# Patient Record
Sex: Female | Born: 1935 | Race: White | Hispanic: No | Smoking: Never smoker
Health system: Southern US, Community
[De-identification: ages and names within clinical notes are randomized; demographics above are authoritative.]

## PROBLEM LIST (undated history)

## (undated) DIAGNOSIS — E039 Hypothyroidism, unspecified: Secondary | ICD-10-CM

## (undated) DIAGNOSIS — K219 Gastro-esophageal reflux disease without esophagitis: Secondary | ICD-10-CM

## (undated) DIAGNOSIS — M810 Age-related osteoporosis without current pathological fracture: Secondary | ICD-10-CM

## (undated) DIAGNOSIS — K5900 Constipation, unspecified: Secondary | ICD-10-CM

## (undated) DIAGNOSIS — E559 Vitamin D deficiency, unspecified: Secondary | ICD-10-CM

## (undated) DIAGNOSIS — S3210XA Unspecified fracture of sacrum, initial encounter for closed fracture: Secondary | ICD-10-CM

## (undated) DIAGNOSIS — R109 Unspecified abdominal pain: Secondary | ICD-10-CM

## (undated) DIAGNOSIS — I10 Essential (primary) hypertension: Secondary | ICD-10-CM

## (undated) DIAGNOSIS — M1990 Unspecified osteoarthritis, unspecified site: Secondary | ICD-10-CM

## (undated) DIAGNOSIS — H269 Unspecified cataract: Secondary | ICD-10-CM

## (undated) HISTORY — DX: Age-related osteoporosis without current pathological fracture: M81.0

## (undated) HISTORY — DX: Unspecified fracture of sacrum, initial encounter for closed fracture: S32.10XA

## (undated) HISTORY — PX: EYE SURGERY: SHX253

## (undated) HISTORY — DX: Unspecified abdominal pain: R10.9

## (undated) HISTORY — DX: Constipation, unspecified: K59.00

## (undated) HISTORY — PX: FIXATION KYPHOPLASTY LUMBAR SPINE: SHX1642

## (undated) HISTORY — DX: Hypothyroidism, unspecified: E03.9

## (undated) HISTORY — DX: Vitamin D deficiency, unspecified: E55.9

## (undated) HISTORY — DX: Unspecified cataract: H26.9

## (undated) HISTORY — DX: Gastro-esophageal reflux disease without esophagitis: K21.9

## (undated) HISTORY — PX: ABDOMINAL HYSTERECTOMY: SHX81

## (undated) HISTORY — DX: Essential (primary) hypertension: I10

---

## 2010-10-05 ENCOUNTER — Ambulatory Visit (HOSPITAL_COMMUNITY)
Admission: RE | Admit: 2010-10-05 | Discharge: 2010-10-05 | Payer: Self-pay | Source: Home / Self Care | Attending: Internal Medicine | Admitting: Internal Medicine

## 2012-10-25 DIAGNOSIS — M549 Dorsalgia, unspecified: Secondary | ICD-10-CM

## 2013-01-07 ENCOUNTER — Other Ambulatory Visit: Payer: Self-pay | Admitting: *Deleted

## 2013-01-21 DIAGNOSIS — IMO0001 Reserved for inherently not codable concepts without codable children: Secondary | ICD-10-CM

## 2013-01-21 DIAGNOSIS — R269 Unspecified abnormalities of gait and mobility: Secondary | ICD-10-CM

## 2013-01-21 DIAGNOSIS — M8448XD Pathological fracture, other site, subsequent encounter for fracture with routine healing: Secondary | ICD-10-CM

## 2013-02-07 ENCOUNTER — Telehealth: Payer: Self-pay | Admitting: Nurse Practitioner

## 2013-02-11 NOTE — Telephone Encounter (Signed)
read

## 2013-02-18 ENCOUNTER — Telehealth: Payer: Self-pay | Admitting: Nurse Practitioner

## 2013-02-18 NOTE — Telephone Encounter (Signed)
appt scheduled for may 14,2014 at 9:45 and pt aware

## 2013-02-22 ENCOUNTER — Telehealth: Payer: Self-pay

## 2013-02-22 NOTE — Telephone Encounter (Signed)
My mother is in a lot of pain  Been to the ER 4 times   Called to get an appt her and was told nothing available till 5/15  Need a referral to a specialist because that is not acceptable

## 2013-02-22 NOTE — Telephone Encounter (Signed)
What kind of pain is she having?

## 2013-02-25 NOTE — Telephone Encounter (Signed)
abdominal

## 2013-03-01 NOTE — Telephone Encounter (Signed)
FYI

## 2013-03-01 NOTE — Telephone Encounter (Signed)
Ok thank you 

## 2013-03-06 ENCOUNTER — Telehealth: Payer: Self-pay | Admitting: Nurse Practitioner

## 2013-03-06 ENCOUNTER — Ambulatory Visit: Payer: Self-pay | Admitting: Nurse Practitioner

## 2013-03-06 MED ORDER — HYDROCODONE-ACETAMINOPHEN 5-325 MG PO TABS: 1.0000 | ORAL_TABLET | Freq: Four times a day (QID) | ORAL | Status: DC | PRN

## 2013-03-06 NOTE — Telephone Encounter (Signed)
MMM TO ADDRESSS

## 2013-03-06 NOTE — Telephone Encounter (Signed)
Patient aware.

## 2013-03-06 NOTE — Telephone Encounter (Signed)
rx ready to pick up

## 2013-03-07 ENCOUNTER — Other Ambulatory Visit: Payer: Self-pay | Admitting: Nurse Practitioner

## 2013-03-07 MED ORDER — OXYCODONE HCL 5 MG PO TABA: 5.0000 mg | ORAL_TABLET | Freq: Two times a day (BID) | ORAL | Status: DC

## 2013-03-13 ENCOUNTER — Ambulatory Visit (INDEPENDENT_AMBULATORY_CARE_PROVIDER_SITE_OTHER): Payer: Medicare Other

## 2013-03-13 ENCOUNTER — Ambulatory Visit: Payer: Self-pay | Admitting: Nurse Practitioner

## 2013-03-13 ENCOUNTER — Ambulatory Visit (INDEPENDENT_AMBULATORY_CARE_PROVIDER_SITE_OTHER): Payer: Medicare Other | Admitting: Pharmacist

## 2013-03-13 ENCOUNTER — Encounter: Payer: Self-pay | Admitting: Pharmacist

## 2013-03-13 VITALS — Ht 61.0 in | Wt 116.0 lb

## 2013-03-13 DIAGNOSIS — R748 Abnormal levels of other serum enzymes: Secondary | ICD-10-CM | POA: Insufficient documentation

## 2013-03-13 DIAGNOSIS — E039 Hypothyroidism, unspecified: Secondary | ICD-10-CM

## 2013-03-13 DIAGNOSIS — S3210XA Unspecified fracture of sacrum, initial encounter for closed fracture: Secondary | ICD-10-CM | POA: Insufficient documentation

## 2013-03-13 DIAGNOSIS — S3210XD Unspecified fracture of sacrum, subsequent encounter for fracture with routine healing: Secondary | ICD-10-CM

## 2013-03-13 DIAGNOSIS — IMO0001 Reserved for inherently not codable concepts without codable children: Secondary | ICD-10-CM

## 2013-03-13 DIAGNOSIS — M810 Age-related osteoporosis without current pathological fracture: Secondary | ICD-10-CM

## 2013-03-13 DIAGNOSIS — Z780 Asymptomatic menopausal state: Secondary | ICD-10-CM

## 2013-03-13 LAB — BASIC METABOLIC PANEL WITH GFR
CO2: 31 mEq/L (ref 19–32)
Calcium: 9.7 mg/dL (ref 8.4–10.5)
Creat: 0.77 mg/dL (ref 0.50–1.10)
GFR, Est African American: 82 mL/min
GFR, Est Non African American: 71 mL/min
Glucose, Bld: 92 mg/dL (ref 70–99)
Potassium: 4.2 mEq/L (ref 3.5–5.3)
Sodium: 142 mEq/L (ref 135–145)

## 2013-03-13 LAB — HEPATIC FUNCTION PANEL
Total Bilirubin: 0.4 mg/dL (ref 0.3–1.2)
Total Protein: 6.7 g/dL (ref 6.0–8.3)

## 2013-03-13 LAB — HM DEXA SCAN

## 2013-03-13 NOTE — Patient Instructions (Signed)

## 2013-03-13 NOTE — Progress Notes (Signed)
Patient ID: Belinda Collins, female   DOB: [birth year], 79 y.o.   MRN: [id number]  [facility] Current Height: Height: 5\' 1"  (154.9 cm)      Max Lifetime Height:  5\' 5"  Current Weight: Weight: 116 lb (52.617 kg)       Ethnicity:Caucasian    HPI: Does pt already have a diagnosis of:  Osteopenia?  No Osteoporosis?  No  Back Pain?  Yes       Kyphosis?  Yes Prior fracture?  Yes - sacral fracture 2013 Med(s) for Osteoporosis/Osteopenia:  none Med(s) previously tried for Osteoporosis/Osteopenia:  none                                                             PMH: Age at menopause:  79 yo Hysterectomy?  No Oophorectomy?  1 ovary removed, 1 ovary remains HRT? No Steroid Use?  No Thyroid med?  Yes History of cancer?  No History of digestive disorders (ie Crohn's)?  Yes Current or previous eating disorders?  No Last Vitamin D Result:  27 (01/2012) Last GFR Result:  54 (12/13/2012)   FH/SH: Family history of osteoporosis?  No Parent with history of hip fracture?  No Family history of breast cancer?  No Exercise?  N Very seldom drinks soda/carbinated drinks Smoking?  No Alcohol?  No    Calcium Assessment Calcium Intake  # of servings/day  Calcium mg  Milk (8 oz) 0.5  x  300  = 150mg   Yogurt (4 oz) 0 x  200 = 0  Cheese (1 oz) 0.5 x  200 = 100mg   Other Calcium sources   250mg   Ca supplement none = none   Estimated calcium intake per day 400mg     DEXA Results Date of Test T-Score for AP Spine L1-L4 T-Score for Total Left Hip T-Score for Total Right Hip  03/13/2013 -3.8 -3.6 -3.6                  Assessment: Severe osteoporosis with h/o of sacral fracture. H/o of vitamin D deficiency  Low dietary calcium intake  Plan:  1.  I recommended Forteo and discussed the benefit of bone building with patient and her daughter, but they both refused because they thought patient could not administer Forteo herself and there if no one else to administer medication.  I showed  them sample and explain how Forteo pen works, but still refused.   I also discussed other alternatives - alendronate or Prolia.  They would like me to discuss these options with Dr. Teena Dunk the patient's gastroenterologist.  I will call Dr. Teena Dunk. 2.  recommend calcium 1200mg  daily either through supplementation   or diet.  Consider calcium citrate and formula with magnesium to help decrease constipation.  3.  Drew the following labs today - LFT, intact PTH, BMET and vitamin D 4.  Counseled and educated about fall risk and prevention.  Recheck DEXA:  2 years  Time spent counseling patient:  30 minutes

## 2013-03-14 LAB — VITAMIN D 25 HYDROXY (VIT D DEFICIENCY, FRACTURES): Vit D, 25-Hydroxy: 22 ng/mL — ABNORMAL LOW (ref 30–89)

## 2013-03-20 ENCOUNTER — Telehealth: Payer: Self-pay | Admitting: Pharmacist

## 2013-03-20 DIAGNOSIS — R748 Abnormal levels of other serum enzymes: Secondary | ICD-10-CM

## 2013-03-20 MED ORDER — VITAMIN D 1000 UNITS PO CAPS: 1000.0000 [IU] | ORAL_CAPSULE | Freq: Every day | ORAL | Status: AC

## 2013-03-20 NOTE — Telephone Encounter (Signed)
Results and labs discussed with patient's daughter.

## 2013-03-20 NOTE — Progress Notes (Signed)
Patient ID: Belinda Collins, female   DOB: 1927-01-08, 79 y.o.   MRN: [id number]   Labs showed low vitamin D and elevated alk phos.  Need to do fractionates alk phos. And start vitamin D3 1000 IU daily

## 2013-03-21 ENCOUNTER — Ambulatory Visit: Payer: Medicare Other | Admitting: Nurse Practitioner

## 2013-03-22 ENCOUNTER — Ambulatory Visit (INDEPENDENT_AMBULATORY_CARE_PROVIDER_SITE_OTHER): Payer: Medicare Other | Admitting: General Practice

## 2013-03-22 ENCOUNTER — Encounter: Payer: Self-pay | Admitting: General Practice

## 2013-03-22 ENCOUNTER — Other Ambulatory Visit: Payer: Self-pay | Admitting: Nurse Practitioner

## 2013-03-22 ENCOUNTER — Other Ambulatory Visit: Payer: Self-pay | Admitting: Pharmacist

## 2013-03-22 VITALS — BP 137/63 | HR 77 | Temp 97.5°F | Ht 61.0 in | Wt 116.0 lb

## 2013-03-22 DIAGNOSIS — R748 Abnormal levels of other serum enzymes: Secondary | ICD-10-CM

## 2013-03-22 DIAGNOSIS — R1013 Epigastric pain: Secondary | ICD-10-CM

## 2013-03-22 NOTE — Progress Notes (Signed)
  Subjective:    Patient ID: Belinda Collins, female    DOB: Mar 26, 1927, 79 y.o.   MRN: [id number]  HPI Patient presents today with abdominal pain (soreness) and lower back pain. Back pain began in January and fractured sacrum. Reports the abdominal pain seems worse after having colonoscopy on February 12, 2013. Reports a blockage was found and she was instructed to take dulcolax and miralax daily. She also reports having acid reflux, but hasn't been taking medication. She reports having multiple xrays, an ultrasound of abdomen, and an MRI. Reports last bowel movement was this am.      Review of Systems  Constitutional: Negative for fever and chills.  Respiratory: Negative for chest tightness and shortness of breath.   Cardiovascular: Negative for chest pain and palpitations.  Gastrointestinal: Negative for diarrhea, constipation and blood in stool.  Genitourinary: Negative for flank pain, difficulty urinating and pelvic pain.  Musculoskeletal: Positive for back pain.  Skin: Negative.   Neurological: Negative for dizziness, weakness and headaches.       Objective:   Physical Exam  Constitutional: She is oriented to person, place, and time. She appears well-developed and well-nourished.  Cardiovascular: Normal rate, regular rhythm and normal heart sounds.   Pulmonary/Chest: Effort normal and breath sounds normal.  Abdominal: Soft. Bowel sounds are normal. She exhibits no distension. There is tenderness in the epigastric area.  Neurological: She is alert and oriented to person, place, and time.  Skin: Skin is warm and dry.  Psychiatric: She has a normal mood and affect.          Assessment & Plan:  1. Abdominal pain, epigastric - Ambulatory referral to Gastroenterology -Restart Nexium -Continue taking medications as GI instructed RTO if symptoms worsen Patient verbalized understanding Coralie Keens, FNP-C

## 2013-03-22 NOTE — Patient Instructions (Addendum)
Gastroesophageal Reflux Disease, Adult  Gastroesophageal reflux disease (GERD) happens when acid from your stomach flows up into the esophagus. When acid comes in contact with the esophagus, the acid causes soreness (inflammation) in the esophagus. Over time, GERD may create small holes (ulcers) in the lining of the esophagus.  CAUSES   · Increased body weight. This puts pressure on the stomach, making acid rise from the stomach into the esophagus.  · Smoking. This increases acid production in the stomach.  · Drinking alcohol. This causes decreased pressure in the lower esophageal sphincter (valve or ring of muscle between the esophagus and stomach), allowing acid from the stomach into the esophagus.  · Late evening meals and a full stomach. This increases pressure and acid production in the stomach.  · A malformed lower esophageal sphincter.  Sometimes, no cause is found.  SYMPTOMS   · Burning pain in the lower part of the mid-chest behind the breastbone and in the mid-stomach area. This may occur twice a week or more often.  · Trouble swallowing.  · Sore throat.  · Dry cough.  · Asthma-like symptoms including chest tightness, shortness of breath, or wheezing.  DIAGNOSIS   Your caregiver may be able to diagnose GERD based on your symptoms. In some cases, X-rays and other tests may be done to check for complications or to check the condition of your stomach and esophagus.  TREATMENT   Your caregiver may recommend over-the-counter or prescription medicines to help decrease acid production. Ask your caregiver before starting or adding any new medicines.   HOME CARE INSTRUCTIONS   · Change the factors that you can control. Ask your caregiver for guidance concerning weight loss, quitting smoking, and alcohol consumption.  · Avoid foods and drinks that make your symptoms worse, such as:  · Caffeine or alcoholic drinks.  · Chocolate.  · Peppermint or mint flavorings.  · Garlic and onions.  · Spicy foods.  · Citrus fruits,  such as oranges, lemons, or limes.  · Tomato-based foods such as sauce, chili, salsa, and pizza.  · Fried and fatty foods.  · Avoid lying down for the 3 hours prior to your bedtime or prior to taking a nap.  · Eat small, frequent meals instead of large meals.  · Wear loose-fitting clothing. Do not wear anything tight around your waist that causes pressure on your stomach.  · Raise the head of your bed 6 to 8 inches with wood blocks to help you sleep. Extra pillows will not help.  · Only take over-the-counter or prescription medicines for pain, discomfort, or fever as directed by your caregiver.  · Do not take aspirin, ibuprofen, or other nonsteroidal anti-inflammatory drugs (NSAIDs).  SEEK IMMEDIATE MEDICAL CARE IF:   · You have pain in your arms, neck, jaw, teeth, or back.  · Your pain increases or changes in intensity or duration.  · You develop nausea, vomiting, or sweating (diaphoresis).  · You develop shortness of breath, or you faint.  · Your vomit is green, yellow, black, or looks like coffee grounds or blood.  · Your stool is red, bloody, or black.  These symptoms could be signs of other problems, such as heart disease, gastric bleeding, or esophageal bleeding.  MAKE SURE YOU:   · Understand these instructions.  · Will watch your condition.  · Will get help right away if you are not doing well or get worse.  Document Released: 07/20/2005 Document Revised: 01/02/2012 Document Reviewed: 04/29/2011  [employer]® Patient   Information ©2014 [employer].

## 2013-03-23 ENCOUNTER — Other Ambulatory Visit: Payer: Self-pay | Admitting: Nurse Practitioner

## 2013-03-25 ENCOUNTER — Telehealth: Payer: Self-pay | Admitting: General Practice

## 2013-03-25 NOTE — Telephone Encounter (Signed)
Please advise 

## 2013-03-26 ENCOUNTER — Other Ambulatory Visit: Payer: Self-pay | Admitting: General Practice

## 2013-03-26 DIAGNOSIS — E039 Hypothyroidism, unspecified: Secondary | ICD-10-CM

## 2013-03-26 LAB — ALKALINE PHOSPHATASE ISOENZYMES
Alk Phos Intestinal Calc: 0 U/L (ref <15)
Alk Phos Liver Calc: 63 U/L (ref 5–93)
Alkaline phosphatase (APISO): 105 U/L (ref 33–130)
Bone %: 40 % (ref 16–56)
Intestinal %: 0 % (ref <14)
Liver %: 60 % (ref 44–84)

## 2013-03-26 MED ORDER — LEVOTHYROXINE SODIUM 50 MCG PO TABS: 50.0000 ug | ORAL_TABLET | Freq: Every day | ORAL | Status: DC

## 2013-03-26 NOTE — Telephone Encounter (Signed)
Script sent to pharmacy. thx 

## 2013-03-27 NOTE — Telephone Encounter (Signed)
rx sent

## 2013-03-28 ENCOUNTER — Telehealth: Payer: Self-pay | Admitting: Pharmacist

## 2013-03-28 NOTE — Telephone Encounter (Signed)
Per Dr. Teena Dunk - he is ok with either fosamax or Prolia for this patient.  Called patient's daughter to discuss - decided to check on insurance coverage of Prolia.

## 2013-03-28 NOTE — Telephone Encounter (Signed)
Called to discuss treatment options for osteoporosis with Dr Teena Dunk per request of Mrs. Longanecker and her daughter.  Left message on his nurses VM.  Our suggestions were either Forteo (pt refused), Fosamax or Prolia.  Awaiting call back

## 2013-03-29 ENCOUNTER — Other Ambulatory Visit: Payer: Self-pay | Admitting: Nurse Practitioner

## 2013-04-01 ENCOUNTER — Telehealth: Payer: Self-pay | Admitting: Family Medicine

## 2013-04-01 ENCOUNTER — Telehealth: Payer: Self-pay | Admitting: Nurse Practitioner

## 2013-04-01 ENCOUNTER — Other Ambulatory Visit: Payer: Self-pay | Admitting: Family Medicine

## 2013-04-01 ENCOUNTER — Other Ambulatory Visit: Payer: Self-pay | Admitting: Nurse Practitioner

## 2013-04-01 NOTE — Telephone Encounter (Signed)
Please advise 

## 2013-04-01 NOTE — Telephone Encounter (Signed)
LAST RF 03/07/13. LAST OV 03/22/13. PLEASE PRINT AND CALL PT IF APPROVED. THANKS.

## 2013-04-01 NOTE — Progress Notes (Signed)
Rx was renewed and written by MMM and was at the front desk awaiting pick up. Ashwin Tibbs P. [place] Charon, M.D.

## 2013-04-01 NOTE — Telephone Encounter (Signed)
apparently refilled 03/29/2013.

## 2013-04-01 NOTE — Telephone Encounter (Signed)
rx ready for pickup 

## 2013-04-01 NOTE — Telephone Encounter (Signed)
Daughter called requesting rx for hydrocodone written by MMM [facility] spoke to sapana

## 2013-04-02 NOTE — Telephone Encounter (Signed)
Already picked up rx

## 2013-04-02 NOTE — Telephone Encounter (Signed)
Pt picked up last pm

## 2013-04-03 NOTE — Telephone Encounter (Signed)
rx has already been picked up at pharmacy

## 2013-04-03 NOTE — Telephone Encounter (Signed)
PAtient has already had rx refilled

## 2013-04-11 ENCOUNTER — Telehealth: Payer: Self-pay | Admitting: Nurse Practitioner

## 2013-04-11 MED ORDER — HYDROCODONE-ACETAMINOPHEN 5-325 MG PO TABS: 1.0000 | ORAL_TABLET | Freq: Three times a day (TID) | ORAL | Status: DC | PRN

## 2013-04-11 NOTE — Telephone Encounter (Signed)
rx ready for pickup 

## 2013-04-12 ENCOUNTER — Other Ambulatory Visit: Payer: Self-pay | Admitting: Nurse Practitioner

## 2013-04-12 NOTE — Telephone Encounter (Signed)
Patient aware.

## 2013-05-02 ENCOUNTER — Telehealth: Payer: Self-pay | Admitting: Pharmacist

## 2013-05-02 NOTE — Telephone Encounter (Signed)
Insurance coverage for Prolia would be 80% which mean approximate cost to patient would be $170 per injection.  (prolia administered q6 months) Called and discussed with patient's daughter - she will take with patient and let provider know next week if Belinda Collins would like to start Prolia or fosamax/alendronate.

## 2013-05-08 ENCOUNTER — Encounter: Payer: Self-pay | Admitting: Nurse Practitioner

## 2013-05-08 ENCOUNTER — Ambulatory Visit (INDEPENDENT_AMBULATORY_CARE_PROVIDER_SITE_OTHER): Payer: Medicare Other | Admitting: Nurse Practitioner

## 2013-05-08 VITALS — BP 124/72 | HR 90 | Temp 97.7°F | Ht 61.0 in | Wt 113.0 lb

## 2013-05-08 DIAGNOSIS — K219 Gastro-esophageal reflux disease without esophagitis: Secondary | ICD-10-CM

## 2013-05-08 DIAGNOSIS — K5900 Constipation, unspecified: Secondary | ICD-10-CM

## 2013-05-08 DIAGNOSIS — M810 Age-related osteoporosis without current pathological fracture: Secondary | ICD-10-CM

## 2013-05-08 DIAGNOSIS — E039 Hypothyroidism, unspecified: Secondary | ICD-10-CM

## 2013-05-08 DIAGNOSIS — I10 Essential (primary) hypertension: Secondary | ICD-10-CM | POA: Insufficient documentation

## 2013-05-08 LAB — COMPLETE METABOLIC PANEL WITH GFR
AST: 19 U/L (ref 0–37)
BUN: 19 mg/dL (ref 6–23)
Chloride: 102 mEq/L (ref 96–112)
Creat: 0.76 mg/dL (ref 0.50–1.10)
Potassium: 4.5 mEq/L (ref 3.5–5.3)
Sodium: 139 mEq/L (ref 135–145)
Total Bilirubin: 0.3 mg/dL (ref 0.3–1.2)

## 2013-05-08 LAB — THYROID PANEL WITH TSH: T3 Uptake: 42.4 % — ABNORMAL HIGH (ref 22.5–37.0)

## 2013-05-08 MED ORDER — ESOMEPRAZOLE MAGNESIUM 40 MG PO CPDR: 40.0000 mg | DELAYED_RELEASE_CAPSULE | Freq: Every day | ORAL | Status: DC

## 2013-05-08 MED ORDER — LOSARTAN POTASSIUM-HCTZ 50-12.5 MG PO TABS: 1.0000 | ORAL_TABLET | Freq: Every day | ORAL | Status: DC

## 2013-05-08 MED ORDER — LEVOTHYROXINE SODIUM 50 MCG PO TABS: 50.0000 ug | ORAL_TABLET | Freq: Every day | ORAL | Status: DC

## 2013-05-08 MED ORDER — BISACODYL 5 MG PO TBEC: 5.0000 mg | DELAYED_RELEASE_TABLET | Freq: Every day | ORAL | Status: AC | PRN

## 2013-05-08 MED ORDER — HYDROCODONE-ACETAMINOPHEN 5-325 MG PO TABS: 1.0000 | ORAL_TABLET | Freq: Three times a day (TID) | ORAL | Status: DC | PRN

## 2013-05-08 MED ORDER — DENOSUMAB 60 MG/ML ~~LOC~~ SOLN
60.0000 mg | Freq: Once | SUBCUTANEOUS | Status: AC
  Administered 2013-05-08: 60 mg via SUBCUTANEOUS

## 2013-05-08 NOTE — Progress Notes (Signed)
Subjective:    Patient ID: Belinda Collins, female    DOB: 06-14-1927, 79 y.o.   MRN: [id number]  HPI Patient in for follow-up of multiple medical problems- She is doing well other than C/O chronic back pain from osteoporosis- Her Gi Doctor suggested pain management referral- he has no other complaints today. Patient going to start on prolia.  Patient Active Problem List   Diagnosis Date Noted  . GERD (gastroesophageal reflux disease) 05/08/2013  . Elevated liver enzymes 03/13/2013  . Sacral fracture 03/13/2013  . Hypothyroidism 03/13/2013   Outpatient Encounter Prescriptions as of 05/08/2013  Medication Sig Dispense Refill  . bisacodyl (BISACODYL) 5 MG EC tablet Take 5 mg by mouth daily as needed for constipation.      . calcium carbonate (TUMS EX) 750 MG chewable tablet Chew 1 tablet by mouth daily.      . Cholecalciferol (VITAMIN D) 1000 UNITS capsule Take 1 capsule (1,000 Units total) by mouth daily.  30 capsule    . Docusate Sodium (DOCULASE PO) Take by mouth.      Marland Kitchen HYDROcodone-acetaminophen (NORCO/VICODIN) 5-325 MG per tablet Take 1 tablet by mouth every 8 (eight) hours as needed for pain.  90 tablet  0  . levothyroxine (SYNTHROID, LEVOTHROID) 50 MCG tablet Take 1 tablet (50 mcg total) by mouth daily before breakfast.  90 tablet  0  . losartan-hydrochlorothiazide (HYZAAR) 50-12.5 MG per tablet Take 1 tablet by mouth daily.      Marland Kitchen NEXIUM 40 MG capsule TAKE ONE CAPSULE BY MOUTH EVERY DAY  30 capsule  5  . polyethylene glycol powder (GLYCOLAX/MIRALAX) powder Take 17 g by mouth daily.       No facility-administered encounter medications on file as of 05/08/2013.      Review of Systems  Constitutional: Negative.  Negative for fever and appetite change.  HENT: Negative.   Respiratory: Negative.   Cardiovascular: Negative.   Gastrointestinal: Negative.   Genitourinary: Negative.   Musculoskeletal: Positive for back pain.  Neurological: Negative.   Hematological: Negative.    Psychiatric/Behavioral: Negative.        Objective:   Physical Exam  Constitutional: She is oriented to person, place, and time. She appears well-developed and well-nourished.  HENT:  Nose: Nose normal.  Mouth/Throat: Oropharynx is clear and moist.  Eyes: EOM are normal.  Neck: Trachea normal, normal range of motion and full passive range of motion without pain. Neck supple. No JVD present. Carotid bruit is not present. No thyromegaly present.  Cardiovascular: Normal rate, regular rhythm, normal heart sounds and intact distal pulses.  Exam reveals no gallop and no friction rub.   No murmur heard. Pulmonary/Chest: Effort normal and breath sounds normal.  Abdominal: Soft. Bowel sounds are normal. She exhibits no distension and no mass. There is no tenderness.  Musculoskeletal: Normal range of motion.  Lymphadenopathy:    She has no cervical adenopathy.  Neurological: She is alert and oriented to person, place, and time. She has normal reflexes.  Skin: Skin is warm and dry.  Psychiatric: She has a normal mood and affect. Her behavior is normal. Judgment and thought content normal.   BP 124/72  Pulse 90  Temp(Src) 97.7 F (36.5 C) (Oral)  Ht 5\' 1"  (1.549 m)  Wt 113 lb (51.256 kg)  BMI 21.36 kg/m2        Assessment & Plan:   1. GERD (gastroesophageal reflux disease)   2. Unspecified hypothyroidism   3. Hypertension   4. Osteoporosis   5. Constipation  Orders Placed This Encounter  Procedures  . COMPLETE METABOLIC PANEL WITH GFR  . NMR Lipoprofile with Lipids  . Thyroid Panel With TSH  . Ambulatory referral to Pain Clinic    Referral Priority:  Routine    Referral Type:  Consultation    Referral Reason:  Specialty Services Required    Requested Specialty:  Pain Medicine    Number of Visits Requested:  1   Outpatient Encounter Prescriptions as of 05/08/2013  Medication Sig Dispense Refill  . bisacodyl (BISACODYL) 5 MG EC tablet Take 1 tablet (5 mg total) by mouth  daily as needed for constipation.  30 tablet  5  . calcium carbonate (TUMS EX) 750 MG chewable tablet Chew 1 tablet by mouth daily.      . Cholecalciferol (VITAMIN D) 1000 UNITS capsule Take 1 capsule (1,000 Units total) by mouth daily.  30 capsule    . Docusate Sodium (DOCULASE PO) Take by mouth.      . esomeprazole (NEXIUM) 40 MG capsule Take 1 capsule (40 mg total) by mouth daily before breakfast.  30 capsule  5  . HYDROcodone-acetaminophen (NORCO/VICODIN) 5-325 MG per tablet Take 1 tablet by mouth every 8 (eight) hours as needed for pain.  90 tablet  0  . levothyroxine (SYNTHROID, LEVOTHROID) 50 MCG tablet Take 1 tablet (50 mcg total) by mouth daily before breakfast.  90 tablet  0  . losartan-hydrochlorothiazide (HYZAAR) 50-12.5 MG per tablet Take 1 tablet by mouth daily.  30 tablet  5  . polyethylene glycol powder (GLYCOLAX/MIRALAX) powder Take 17 g by mouth daily.      . [DISCONTINUED] bisacodyl (BISACODYL) 5 MG EC tablet Take 5 mg by mouth daily as needed for constipation.      . [DISCONTINUED] HYDROcodone-acetaminophen (NORCO/VICODIN) 5-325 MG per tablet Take 1 tablet by mouth every 8 (eight) hours as needed for pain.  90 tablet  0  . [DISCONTINUED] levothyroxine (SYNTHROID, LEVOTHROID) 50 MCG tablet Take 1 tablet (50 mcg total) by mouth daily before breakfast.  90 tablet  0  . [DISCONTINUED] losartan-hydrochlorothiazide (HYZAAR) 50-12.5 MG per tablet Take 1 tablet by mouth daily.      . [DISCONTINUED] NEXIUM 40 MG capsule TAKE ONE CAPSULE BY MOUTH EVERY DAY  30 capsule  5  . [EXPIRED] denosumab (PROLIA) injection 60 mg        No facility-administered encounter medications on file as of 05/08/2013.   Continue all meds Labs pending Weight bearing exercise encouraged Referral to pain clinic Follow up in 3 months  Mary-Margaret Daphine Deutscher, FNP

## 2013-05-08 NOTE — Patient Instructions (Signed)
Denosumab injection What is this medicine? DENOSUMAB slows bone breakdown. It is used to treat osteoporosis in women after menopause and in men. This medicine is also used to prevent bone fractures and other bone problems caused by cancer bone metastases. This medicine may be used for other purposes; ask your health care provider or pharmacist if you have questions. What should I tell my health care provider before I take this medicine? They need to know if you have any of these conditions: -dental disease -eczema -infection or history of infections -kidney disease or on dialysis -low blood calcium or vitamin D -malabsorption syndrome -scheduled to have surgery or tooth extraction -taking medicine that contains denosumab -thyroid or parathyroid disease -an unusual reaction to denosumab, other medicines, foods, dyes, or preservatives -pregnant or trying to get pregnant -breast-feeding How should I use this medicine? This medicine is for injection under the skin. It is given by a health care professional in a hospital or clinic setting. If you are getting Prolia, a special MedGuide will be given to you by the pharmacist with each prescription and refill. Be sure to read this information carefully each time. Talk to your pediatrician regarding the use of this medicine in children. Special care may be needed. Overdosage: If you think you've taken too much of this medicine contact a poison control center or emergency room at once. Overdosage: If you think you have taken too much of this medicine contact a poison control center or emergency room at once. NOTE: This medicine is only for you. Do not share this medicine with others. What if I miss a dose? It is important not to miss your dose. Call your doctor or health care professional if you are unable to keep an appointment. What may interact with this medicine? Do not take this medicine with any of the following medications: -other medicines  containing denosumab This medicine may also interact with the following medications: -medicines that suppress the immune system -medicines that treat cancer -steroid medicines like prednisone or cortisone This list may not describe all possible interactions. Give your health care provider a list of all the medicines, herbs, non-prescription drugs, or dietary supplements you use. Also tell them if you smoke, drink alcohol, or use illegal drugs. Some items may interact with your medicine. What should I watch for while using this medicine? Visit your doctor or health care professional for regular checks on your progress. Your doctor or health care professional may order blood tests and other tests to see how you are doing. Call your doctor or health care professional if you get a cold or other infection while receiving this medicine. Do not treat yourself. This medicine may decrease your body's ability to fight infection. You should make sure you get enough calcium and vitamin D while you are taking this medicine, unless your doctor tells you not to. Discuss the foods you eat and the vitamins you take with your health care professional. See your dentist regularly. Brush and floss your teeth as directed. Before you have any dental work done, tell your dentist you are receiving this medicine. What side effects may I notice from receiving this medicine? Side effects that you should report to your doctor or health care professional as soon as possible: -allergic reactions like skin rash, itching or hives, swelling of the face, lips, or tongue -breathing problems -chest pain -fast, irregular heartbeat -feeling faint or lightheaded, falls -fever, chills, or any other sign of infection -muscle spasms, tightening, or twitches -numbness   or tingling -skin blisters or bumps, or is dry, peels, or red -slow healing or unexplained pain in the mouth or jaw -unusual bleeding or bruising Side effects that  usually do not require medical attention (Report these to your doctor or health care professional if they continue or are bothersome.): -muscle pain -stomach upset, gas This list may not describe all possible side effects. Call your doctor for medical advice about side effects. You may report side effects to FDA at 1-800-FDA-1088. Where should I keep my medicine? This medicine is only given in a clinic, doctor's office, or other health care setting and will not be stored at home. NOTE: This sheet is a summary. It may not cover all possible information. If you have questions about this medicine, talk to your doctor, pharmacist, or health care provider.  2013, [employer] Standard. (07/19/2011 3:40:41 PM)  

## 2013-05-09 LAB — NMR LIPOPROFILE WITH LIPIDS
Cholesterol, Total: 195 mg/dL (ref <200)
HDL Particle Number: 33.2 umol/L (ref >=30.5)
HDL-C: 54 mg/dL (ref >=40)
LDL (calc): 89 mg/dL (ref <100)
LDL Particle Number: 1136 nmol/L — ABNORMAL HIGH (ref <1000)
LDL Size: 21.3 nm (ref >20.5)
LP-IR Score: <25 (ref <=45)
Large HDL-P: 10.5 umol/L (ref >=4.8)
Large VLDL-P: 2.4 nmol/L (ref <=2.7)
Triglycerides: 260 mg/dL — ABNORMAL HIGH (ref <150)
VLDL Size: 45.7 nm (ref <=46.6)

## 2013-05-15 ENCOUNTER — Other Ambulatory Visit: Payer: Self-pay | Admitting: Nurse Practitioner

## 2013-05-16 NOTE — Telephone Encounter (Signed)
Last seen 05/08/13 MMM  Last filled 7/16  If approved print and route to patient

## 2013-05-20 ENCOUNTER — Telehealth: Payer: Self-pay | Admitting: *Deleted

## 2013-05-20 NOTE — Telephone Encounter (Signed)
Pt notified RX for Vicodin called into [facility]

## 2013-06-10 ENCOUNTER — Telehealth: Payer: Self-pay | Admitting: Nurse Practitioner

## 2013-06-10 ENCOUNTER — Other Ambulatory Visit: Payer: Self-pay | Admitting: Nurse Practitioner

## 2013-06-10 NOTE — Telephone Encounter (Signed)
Last nirco rx was for #90 and suppose to take 3 pills a day- wasn't filled until 7/23- not due until at least 06/15/13- to early to get

## 2013-06-11 NOTE — Telephone Encounter (Signed)
rx ready for pick up but cannot fill until 06/15/13

## 2013-06-11 NOTE — Telephone Encounter (Signed)
Pt aware.

## 2013-06-11 NOTE — Telephone Encounter (Signed)
Last seen MMM 05/08/13  Last filled 05/15/13  If approved print and route to nurse

## 2013-06-12 NOTE — Telephone Encounter (Signed)
Up front patient aware 

## 2013-06-25 NOTE — Telephone Encounter (Signed)
RX done 06-11-2013. Pt was called.

## 2013-07-22 ENCOUNTER — Other Ambulatory Visit: Payer: Self-pay | Admitting: Nurse Practitioner

## 2013-09-02 ENCOUNTER — Telehealth: Payer: Self-pay | Admitting: Nurse Practitioner

## 2013-09-02 NOTE — Telephone Encounter (Signed)
yes

## 2013-09-03 NOTE — Telephone Encounter (Signed)
Patient aware.

## 2013-09-05 ENCOUNTER — Ambulatory Visit (INDEPENDENT_AMBULATORY_CARE_PROVIDER_SITE_OTHER): Payer: Medicare Other

## 2013-09-05 ENCOUNTER — Encounter (INDEPENDENT_AMBULATORY_CARE_PROVIDER_SITE_OTHER): Payer: Self-pay

## 2013-09-05 DIAGNOSIS — Z23 Encounter for immunization: Secondary | ICD-10-CM

## 2013-09-18 ENCOUNTER — Telehealth: Payer: Self-pay | Admitting: Nurse Practitioner

## 2013-09-18 MED ORDER — TRAMADOL HCL 50 MG PO TABS: 50.0000 mg | ORAL_TABLET | Freq: Two times a day (BID) | ORAL | Status: DC

## 2013-09-18 NOTE — Telephone Encounter (Signed)
rx ready for pickup 

## 2013-09-18 NOTE — Telephone Encounter (Signed)
Patient has already picked up.

## 2013-10-10 DIAGNOSIS — Z0289 Encounter for other administrative examinations: Secondary | ICD-10-CM

## 2013-10-22 ENCOUNTER — Telehealth: Payer: Self-pay | Admitting: Nurse Practitioner

## 2013-10-23 NOTE — Telephone Encounter (Signed)
On MMM desk, pt aware to pick up Monday 10/28/13

## 2013-11-06 ENCOUNTER — Telehealth: Payer: Self-pay | Admitting: Pharmacist

## 2013-11-06 NOTE — Telephone Encounter (Signed)
Patient called - she is to have kyphoplasty Friday 11/08/13. We will wait on Prolia until after procedure and patient healed.

## 2013-11-14 ENCOUNTER — Encounter: Payer: Self-pay | Admitting: Pharmacist

## 2013-11-14 NOTE — Telephone Encounter (Signed)
Received call from Dr Roy's office that hey performed kyphoplasty.  Patient is ready for next Prolia injection - tried to call her today but no answer.

## 2013-11-18 NOTE — Telephone Encounter (Signed)
appt made for 11/21/13 at 2:20pm

## 2013-11-21 ENCOUNTER — Ambulatory Visit (INDEPENDENT_AMBULATORY_CARE_PROVIDER_SITE_OTHER): Payer: Medicare Other | Admitting: Pharmacist

## 2013-11-21 ENCOUNTER — Encounter: Payer: Self-pay | Admitting: Pharmacist

## 2013-11-21 DIAGNOSIS — M810 Age-related osteoporosis without current pathological fracture: Secondary | ICD-10-CM

## 2013-11-21 DIAGNOSIS — S3210XA Unspecified fracture of sacrum, initial encounter for closed fracture: Secondary | ICD-10-CM

## 2013-11-21 DIAGNOSIS — M8440XA Pathological fracture, unspecified site, initial encounter for fracture: Secondary | ICD-10-CM

## 2013-11-21 DIAGNOSIS — M8080XA Other osteoporosis with current pathological fracture, unspecified site, initial encounter for fracture: Secondary | ICD-10-CM

## 2013-11-21 DIAGNOSIS — S322XXA Fracture of coccyx, initial encounter for closed fracture: Secondary | ICD-10-CM

## 2013-11-21 MED ORDER — DENOSUMAB 60 MG/ML ~~LOC~~ SOLN
60.0000 mg | Freq: Once | SUBCUTANEOUS | Status: AC
  Administered 2013-11-21: 60 mg via SUBCUTANEOUS

## 2013-11-21 NOTE — Progress Notes (Signed)
Patient ID: Belinda Collins, female   DOB: 28-Sep-1927, 80 y.o.   MRN: [id number]  [facility]   HPI: Patient with osteoporrsis here today for Prolia injection - last injection was 04/2013.  Back Pain?  Yes     - recently had kyphoplasty  Kyphosis?  Yes Prior fracture?  Yes - sacral fracture 2013 Med(s) for Osteoporosis/Osteopenia:  prolia - first injeciton 04/2013 Med(s) previously tried for Osteoporosis/Osteopenia:  none                                                             PMH: Age at menopause:  80 yo Hysterectomy?  No Oophorectomy?  1 ovary removed, 1 ovary remains HRT? No Steroid Use?  No Thyroid med?  Yes History of cancer?  No History of digestive disorders (ie Crohn's)?  Yes Current or previous eating disorders?  No Last Vitamin D Result:  22 (03/13/2013) Last GFR Result:  72 (04/2013)   FH/SH: Family history of osteoporosis?  No Parent with history of hip fracture?  No Family history of breast cancer?  No Exercise?  N Very seldom drinks soda/carbinated drinks Smoking?  No Alcohol?  No    Calcium Assessment Calcium Intake  # of servings/day  Calcium mg  Milk (8 oz) 0.5  x  300  = 150mg   Yogurt (4 oz) 0 x  200 = 0  Cheese (1 oz) 0.5 x  200 = 100mg   Other Calcium sources   250mg   Ca supplement 2 tums per day = 1000   Estimated calcium intake per day 1400mg     DEXA Results Date of Test T-Score for AP Spine L1-L4 T-Score for Total Left Hip T-Score for Total Right Hip  03/13/2013 -3.8 -3.6 -3.6                  Assessment: Severe osteoporosis with h/o of sacral fracture and recent kyphoplasty H/o of vitamin D deficiency    Plan:  1.  Continue prolia 60mg  injected SQ every 6 months - given today  2.  recommend calcium 1200mg  daily either through supplementation   or diet. 3.  Needs vitamin D rechecked 4.  Counseled and educated about fall risk and prevention.  Recheck DEXA:  2 years  Time spent counseling patient:  20 minutes  Belinda Collins  Belinda Collins, PharmD, CPP

## 2013-12-04 ENCOUNTER — Other Ambulatory Visit: Payer: Self-pay | Admitting: Nurse Practitioner

## 2013-12-05 NOTE — Telephone Encounter (Signed)
Last OV 06/14

## 2014-01-10 ENCOUNTER — Other Ambulatory Visit: Payer: Self-pay | Admitting: *Deleted

## 2014-01-10 NOTE — Telephone Encounter (Signed)
Patient last seen in office on 11-21-13 by pharmacist and 05-08-13 for chronic follow up. Rx last filled on 09-18-13. Please advise. If approved please print and route to Pool B so nurse can call patient to pick up

## 2014-01-10 NOTE — Telephone Encounter (Signed)
ntbs for pain rx

## 2014-01-13 ENCOUNTER — Other Ambulatory Visit: Payer: Self-pay | Admitting: *Deleted

## 2014-01-13 MED ORDER — TRAMADOL HCL 50 MG PO TABS
50.0000 mg | ORAL_TABLET | Freq: Two times a day (BID) | ORAL | Status: DC
Start: 2014-01-13 — End: 2014-01-13

## 2014-01-13 MED ORDER — TRAMADOL HCL 50 MG PO TABS: 50.0000 mg | ORAL_TABLET | Freq: Two times a day (BID) | ORAL | Status: DC

## 2014-01-13 NOTE — Telephone Encounter (Signed)
Last saw you 07/14, saw Tammy 11/21/13, last filled 09/18/13. Uses [facility]

## 2014-01-13 NOTE — Telephone Encounter (Signed)
rx ready for pickup 

## 2014-01-13 NOTE — Telephone Encounter (Signed)
This was on phone in can you change to print

## 2014-03-14 ENCOUNTER — Other Ambulatory Visit: Payer: Self-pay

## 2014-03-14 DIAGNOSIS — I10 Essential (primary) hypertension: Secondary | ICD-10-CM

## 2014-03-14 MED ORDER — LOSARTAN POTASSIUM-HCTZ 50-12.5 MG PO TABS: 1.0000 | ORAL_TABLET | Freq: Every day | ORAL | Status: DC

## 2014-04-21 ENCOUNTER — Other Ambulatory Visit: Payer: Self-pay | Admitting: Nurse Practitioner

## 2014-04-22 ENCOUNTER — Ambulatory Visit (INDEPENDENT_AMBULATORY_CARE_PROVIDER_SITE_OTHER): Payer: Medicare Other | Admitting: Nurse Practitioner

## 2014-04-22 ENCOUNTER — Telehealth: Payer: Self-pay | Admitting: Nurse Practitioner

## 2014-04-22 ENCOUNTER — Encounter: Payer: Self-pay | Admitting: Nurse Practitioner

## 2014-04-22 VITALS — BP 136/74 | HR 76 | Temp 98.0°F | Ht 61.0 in | Wt 112.0 lb

## 2014-04-22 DIAGNOSIS — R300 Dysuria: Secondary | ICD-10-CM

## 2014-04-22 DIAGNOSIS — N390 Urinary tract infection, site not specified: Secondary | ICD-10-CM

## 2014-04-22 LAB — POCT URINALYSIS DIPSTICK
BILIRUBIN UA: NEGATIVE
Glucose, UA: NEGATIVE
Ketones, UA: NEGATIVE
NITRITE UA: NEGATIVE
PH UA: 6.5
PROTEIN UA: NEGATIVE
Spec Grav, UA: 1.01
Urobilinogen, UA: NEGATIVE

## 2014-04-22 LAB — POCT UA - MICROSCOPIC ONLY
CASTS, UR, LPF, POC: NEGATIVE
CRYSTALS, UR, HPF, POC: NEGATIVE
Mucus, UA: NEGATIVE
Yeast, UA: NEGATIVE

## 2014-04-22 MED ORDER — IBUPROFEN 400 MG PO TABS: ORAL_TABLET | ORAL | Status: DC

## 2014-04-22 MED ORDER — CIPROFLOXACIN HCL 500 MG PO TABS: 500.0000 mg | ORAL_TABLET | Freq: Two times a day (BID) | ORAL | Status: DC

## 2014-04-22 NOTE — Telephone Encounter (Signed)
Last seen 11/21/13  Belinda Collins

## 2014-04-22 NOTE — Telephone Encounter (Signed)
Patient needs labs - I only saw for osteoporosis.  See needs follow up with her PCP

## 2014-04-22 NOTE — Telephone Encounter (Signed)
appt scheduled for today with mmm. 

## 2014-04-22 NOTE — Progress Notes (Signed)
Subjective:    Patient ID: Belinda Collins, female    DOB: 1927/05/07, 80 y.o.   MRN: [id number]  Urinary Tract Infection  This is a new problem. The current episode started in the past 7 days. The problem occurs intermittently. The problem has been unchanged. The pain is at a severity of 8/10 (lower back pain. ). The pain is mild. There has been no fever. She is not sexually active. There is no history of pyelonephritis. Associated symptoms include frequency. Pertinent negatives include no discharge, hematuria, possible pregnancy or urgency. Treatments tried: ibuprofen.  The treatment provided mild relief. There is no history of kidney stones or recurrent UTIs.      Review of Systems  Constitutional: Negative.   HENT: Negative.   Eyes: Negative.   Respiratory: Negative.   Cardiovascular: Negative.   Gastrointestinal: Negative.   Endocrine: Negative.   Genitourinary: Positive for frequency. Negative for urgency and hematuria.  Musculoskeletal: Negative.   Skin: Negative.   Allergic/Immunologic: Negative.   Neurological: Negative.   Hematological: Negative.   Psychiatric/Behavioral: Negative.        Objective:   Physical Exam  Constitutional: She is oriented to person, place, and time. She appears well-developed.  HENT:  Head: Normocephalic.  Eyes: Pupils are equal, round, and reactive to light.  Neck: Normal range of motion.  Cardiovascular: Normal rate.   Pulmonary/Chest: Effort normal.  Abdominal: Soft.  Musculoskeletal: Normal range of motion.  No CVA tenderness.   Neurological: She is alert and oriented to person, place, and time.  Skin: Skin is warm.    BP 136/74  Pulse 76  Temp(Src) 98 F (36.7 C) (Oral)  Ht 5\' 1"  (1.549 m)  Wt 112 lb (50.803 kg)  BMI 21.17 kg/m2   Results for orders placed in visit on 04/22/14  POCT URINALYSIS DIPSTICK      Result Value Ref Range   Color, UA gold     Clarity, UA clear     Glucose, UA neg     Bilirubin, UA neg     Ketones, UA neg     Spec Grav, UA 1.010     Blood, UA mod     pH, UA 6.5     Protein, UA neg     Urobilinogen, UA negative     Nitrite, UA neg     Leukocytes, UA Trace    POCT UA - MICROSCOPIC ONLY      Result Value Ref Range   WBC, Ur, HPF, POC 5-8     RBC, urine, microscopic 1-3     Bacteria, U Microscopic occ     Mucus, UA neg     Epithelial cells, urine per micros few     Crystals, Ur, HPF, POC neg     Casts, Ur, LPF, POC neg     Yeast, UA neg          Assessment & Plan:   1. Dysuria   2. Urinary tract infection without hematuria, site unspecified    Meds ordered this encounter  Medications  . ciprofloxacin (CIPRO) 500 MG tablet    Sig: Take 1 tablet (500 mg total) by mouth 2 (two) times daily.    Dispense:  6 tablet    Refill:  0    Order Specific Question:  Supervising Ledford Goodson    Answer:  Ernestina PennaMOORE, DONALD W [[id number]]  . ibuprofen (ADVIL,MOTRIN) 400 MG tablet    Sig: TAKE 1 TABLET BY MOUTH EVERY FOUR HOURS AS NEEDED  Dispense:  90 tablet    Refill:  0    Order Specific Question:  Supervising Skyelar Halliday    Answer:  Deborra MedinaMOORE, DONALD W [[id number]]   Force fluids AZO over the counter X2 days RTO prn Culture pending  Mary-Margaret Daphine DeutscherMartin, FNP

## 2014-04-24 LAB — URINE CULTURE

## 2014-05-06 ENCOUNTER — Telehealth: Payer: Self-pay | Admitting: Pharmacist

## 2014-05-06 NOTE — Telephone Encounter (Signed)
Tried to call patient to schedule appt for Prolia for around 05/21/14.  No answer.

## 2014-05-07 NOTE — Telephone Encounter (Signed)
appt made for prolia fro 05/21/14 at 11:50am

## 2014-05-17 IMAGING — CR DG HIP COMPLETE 2+V*R*
2 series · 2 of 2 positions shown · non-contrast
Comparison: Pelvic CT 11/03/2012

CLINICAL DATA: Pain, coccygeal fracture

RIGHT HIP - COMPLETE 2+ VIEW

[view not recorded (1 of 2)]
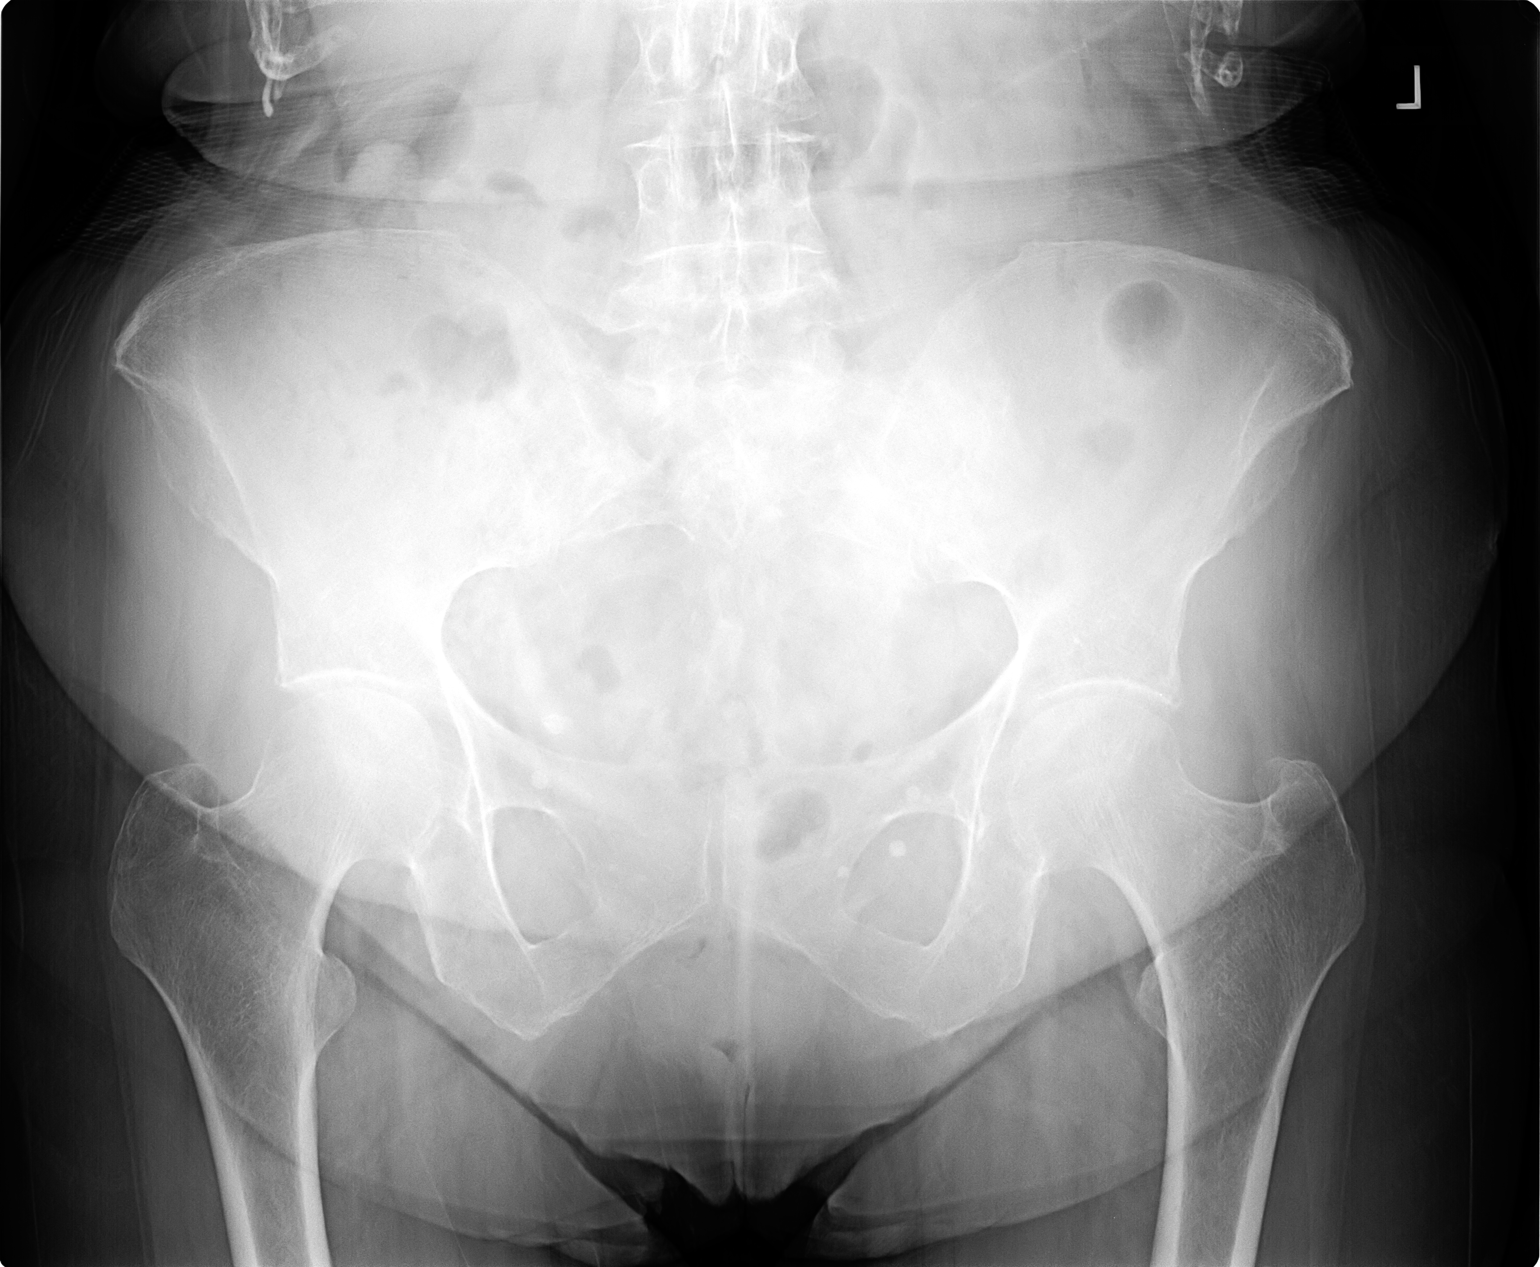

[view not recorded (2 of 2)]
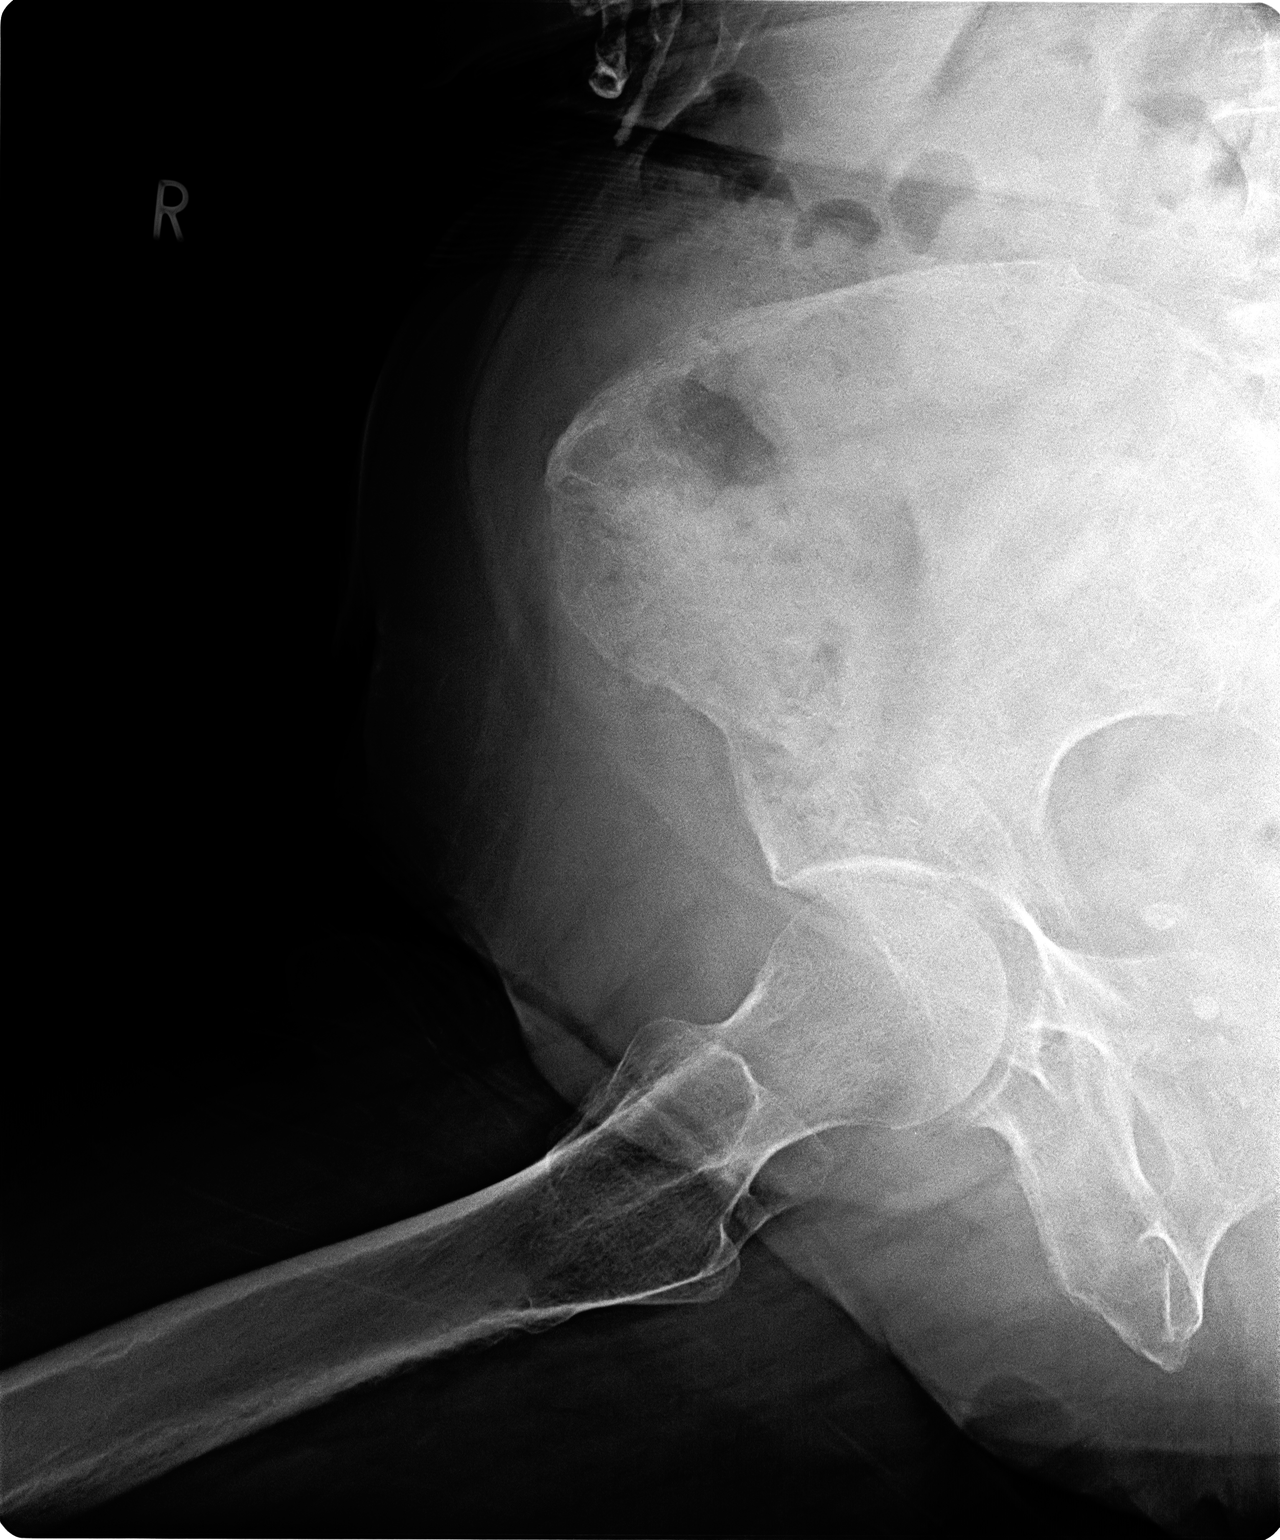

[2 of 2 positions shown; findings below may reference images not displayed]

FINDINGS: Osseous demineralization.
Hip joint spaces symmetric and fairly well preserved.
Numerous pelvic phleboliths.
No acute fracture, dislocation or bone destruction.
SI joints poorly visualized due to degree of demineralization.
IMPRESSION: Osseous demineralization.
No acute abnormalities.

## 2014-05-22 ENCOUNTER — Ambulatory Visit (INDEPENDENT_AMBULATORY_CARE_PROVIDER_SITE_OTHER): Payer: Medicare Other | Admitting: Pharmacist

## 2014-05-22 ENCOUNTER — Encounter: Payer: Self-pay | Admitting: Pharmacist

## 2014-05-22 VITALS — BP 118/64 | HR 70 | Ht 61.0 in | Wt 110.5 lb

## 2014-05-22 DIAGNOSIS — R7989 Other specified abnormal findings of blood chemistry: Secondary | ICD-10-CM

## 2014-05-22 DIAGNOSIS — R945 Abnormal results of liver function studies: Secondary | ICD-10-CM

## 2014-05-22 DIAGNOSIS — M810 Age-related osteoporosis without current pathological fracture: Secondary | ICD-10-CM

## 2014-05-22 DIAGNOSIS — R748 Abnormal levels of other serum enzymes: Secondary | ICD-10-CM

## 2014-05-22 DIAGNOSIS — Z0000 Encounter for general adult medical examination without abnormal findings: Secondary | ICD-10-CM

## 2014-05-22 DIAGNOSIS — K5909 Other constipation: Secondary | ICD-10-CM

## 2014-05-22 DIAGNOSIS — M8080XD Other osteoporosis with current pathological fracture, unspecified site, subsequent encounter for fracture with routine healing: Secondary | ICD-10-CM

## 2014-05-22 DIAGNOSIS — E039 Hypothyroidism, unspecified: Secondary | ICD-10-CM

## 2014-05-22 MED ORDER — LUBIPROSTONE 8 MCG PO CAPS: 8.0000 ug | ORAL_CAPSULE | Freq: Two times a day (BID) | ORAL | Status: DC

## 2014-05-22 MED ORDER — DENOSUMAB 60 MG/ML ~~LOC~~ SOLN
60.0000 mg | Freq: Once | SUBCUTANEOUS | Status: AC
  Administered 2014-05-22: 60 mg via SUBCUTANEOUS

## 2014-05-22 NOTE — Patient Instructions (Addendum)
Health Maintenance Summary    TETANUS/TDAP Overdue 03/02/1946  Checking on insurance coverage    ZOSTAVAX Overdue 03/02/1989  Checking on insurance coverage   Dexa / Bone Denisty Next Due 03/14/2015 Last 03/13/2013    PNEUMOCOCCAL POLYSACCHARIDE VACCINE AGE 78 AND OVER Overdue 03/02/1994  Due now     INFLUENZA VACCINE Next Due 05/24/2014  Last 08/2013    COLONOSCOPY Next Due 02/13/2023  Last was 02/12/2013       Fall Prevention and Home Safety Falls cause injuries and can affect all age groups. It is possible to use preventive measures to significantly decrease the likelihood of falls. There are many simple measures which can make your home safer and prevent falls. OUTDOORS  Repair cracks and edges of walkways and driveways.  Remove high doorway thresholds.  Trim shrubbery on the main path into your home.  Have good outside lighting.  Clear walkways of tools, rocks, debris, and clutter.  Check that handrails are not broken and are securely fastened. Both sides of steps should have handrails.  Have leaves, snow, and ice cleared regularly.  Use sand or salt on walkways during winter months.  In the garage, clean up grease or oil spills. BATHROOM  Install night lights.  Install grab bars by the toilet and in the tub and shower.  Use non-skid mats or decals in the tub or shower.  Place a plastic non-slip stool in the shower to sit on, if needed.  Keep floors dry and clean up all water on the floor immediately.  Remove soap buildup in the tub or shower on a regular basis.  Secure bath mats with non-slip, double-sided rug tape.  Remove throw rugs and tripping hazards from the floors. BEDROOMS  Install night lights.  Make sure a bedside light is easy to reach.  Do not use oversized bedding.  Keep a telephone by your bedside.  Have a firm chair with side arms to use for getting dressed.  Remove throw rugs and tripping hazards from the floor. KITCHEN  Keep handles on  pots and pans turned toward the center of the stove. Use back burners when possible.  Clean up spills quickly and allow time for drying.  Avoid walking on wet floors.  Avoid hot utensils and knives.  Position shelves so they are not too high or low.  Place commonly used objects within easy reach.  If necessary, use a sturdy step stool with a grab bar when reaching.  Keep electrical cables out of the way.  Do not use floor polish or wax that makes floors slippery. If you must use wax, use non-skid floor wax.  Remove throw rugs and tripping hazards from the floor. STAIRWAYS  Never leave objects on stairs.  Place handrails on both sides of stairways and use them. Fix any loose handrails. Make sure handrails on both sides of the stairways are as long as the stairs.  Check carpeting to make sure it is firmly attached along stairs. Make repairs to worn or loose carpet promptly.  Avoid placing throw rugs at the top or bottom of stairways, or properly secure the rug with carpet tape to prevent slippage. Get rid of throw rugs, if possible.  Have an electrician put in a light switch at the top and bottom of the stairs. OTHER FALL PREVENTION TIPS  Wear low-heel or rubber-soled shoes that are supportive and fit well. Wear closed toe shoes.  When using a stepladder, make sure it is fully opened and both spreaders are  firmly locked. Do not climb a closed stepladder.  Add color or contrast paint or tape to grab bars and handrails in your home. Place contrasting color strips on first and last steps.  Learn and use mobility aids as needed. Install an electrical emergency response system.  Turn on lights to avoid dark areas. Replace light bulbs that burn out immediately. Get light switches that glow.  Arrange furniture to create clear pathways. Keep furniture in the same place.  Firmly attach carpet with non-skid or double-sided tape.  Eliminate uneven floor surfaces.  Select a carpet  pattern that does not visually hide the edge of steps.  Be aware of all pets. OTHER HOME SAFETY TIPS  Set the water temperature for 120 F (48.8 C).  Keep emergency numbers on or near the telephone.  Keep smoke detectors on every level of the home and near sleeping areas. Document Released: 09/30/2002 Document Revised: 04/10/2012 Document Reviewed: 12/30/2011 [facility] Patient Information 2015 [place], [place]. This information is not intended to replace advice given to you by your health care provider. Make sure you discuss any questions you have with your health care provider.   Preventive Care for Adults A healthy lifestyle and preventive care can promote health and wellness. Preventive health guidelines for women include the following key practices.  A routine yearly physical is a good way to check with your health care provider about your health and preventive screening. It is a chance to share any concerns and updates on your health and to receive a thorough exam.  Visit your dentist for a routine exam and preventive care every 6 months. Brush your teeth twice a day and floss once a day. Good oral hygiene prevents tooth decay and gum disease.  The frequency of eye exams is based on your age, health, family medical history, use of contact lenses, and other factors. Follow your health care provider's recommendations for frequency of eye exams.  Eat a healthy diet. Foods like vegetables, fruits, whole grains, low-fat dairy products, and lean protein foods contain the nutrients you need without too many calories. Decrease your intake of foods high in solid fats, added sugars, and salt. Eat the right amount of calories for you.Get information about a proper diet from your health care provider, if necessary.  Regular physical exercise is one of the most important things you can do for your health. Most adults should get at least 150 minutes of moderate-intensity exercise (any activity that  increases your heart rate and causes you to sweat) each week. In addition, most adults need muscle-strengthening exercises on 2 or more days a week.  Maintain a healthy weight. The body mass index (BMI) is a screening tool to identify possible weight problems. It provides an estimate of body fat based on height and weight. Your health care provider can find your BMI and can help you achieve or maintain a healthy weight.For adults 20 years and older:  A BMI below 18.5 is considered underweight.  A BMI of 18.5 to 24.9 is normal.  A BMI of 25 to 29.9 is considered overweight.  A BMI of 30 and above is considered obese.  Maintain normal blood lipids and cholesterol levels by exercising and minimizing your intake of saturated fat. Eat a balanced diet with plenty of fruit and vegetables. Blood tests for lipids and cholesterol should begin at age 64 and be repeated every 5 years. If your lipid or cholesterol levels are high, you are over 50, or you are at high  risk for heart disease, you may need your cholesterol levels checked more frequently.Ongoing high lipid and cholesterol levels should be treated with medicines if diet and exercise are not working.  If you smoke, find out from your health care provider how to quit. If you do not use tobacco, do not start.  Lung cancer screening is recommended for adults aged 87-80 years who are at high risk for developing lung cancer because of a history of smoking. A yearly low-dose CT scan of the lungs is recommended for people who have at least a 30-pack-year history of smoking and are a current smoker or have quit within the past 15 years. A pack year of smoking is smoking an average of 1 pack of cigarettes a day for 1 year (for example: 1 pack a day for 30 years or 2 packs a day for 15 years). Yearly screening should continue until the smoker has stopped smoking for at least 15 years. Yearly screening should be stopped for people who develop a health problem  that would prevent them from having lung cancer treatment.  If you are pregnant, do not drink alcohol. If you are breastfeeding, be very cautious about drinking alcohol. If you are not pregnant and choose to drink alcohol, do not have more than 1 drink per day. One drink is considered to be 12 ounces (355 mL) of beer, 5 ounces (148 mL) of wine, or 1.5 ounces (44 mL) of liquor.  Avoid use of street drugs. Do not share needles with anyone. Ask for help if you need support or instructions about stopping the use of drugs.  High blood pressure causes heart disease and increases the risk of stroke. Your blood pressure should be checked at least every 1 to 2 years. Ongoing high blood pressure should be treated with medicines if weight loss and exercise do not work.  If you are 8-34 years old, ask your health care provider if you should take aspirin to prevent strokes.  Diabetes screening involves taking a blood sample to check your fasting blood sugar level. This should be done once every 3 years, after age 61, if you are within normal weight and without risk factors for diabetes. Testing should be considered at a younger age or be carried out more frequently if you are overweight and have at least 1 risk factor for diabetes.  Breast cancer screening is essential preventive care for women. You should practice "breast self-awareness." This means understanding the normal appearance and feel of your breasts and may include breast self-examination. Any changes detected, no matter how small, should be reported to a health care provider. Women in their 70s and 30s should have a clinical breast exam (CBE) by a health care provider as part of a regular health exam every 1 to 3 years. After age 52, women should have a CBE every year. Starting at age 54, women should consider having a mammogram (breast X-ray test) every year. Women who have a family history of breast cancer should talk to their health care provider  about genetic screening. Women at a high risk of breast cancer should talk to their health care providers about having an MRI and a mammogram every year.  Breast cancer gene (BRCA)-related cancer risk assessment is recommended for women who have family members with BRCA-related cancers. BRCA-related cancers include breast, ovarian, tubal, and peritoneal cancers. Having family members with these cancers may be associated with an increased risk for harmful changes (mutations) in the breast cancer genes BRCA1  and BRCA2. Results of the assessment will determine the need for genetic counseling and BRCA1 and BRCA2 testing.  Routine pelvic exams to screen for cancer are no longer recommended for nonpregnant women who are considered low risk for cancer of the pelvic organs (ovaries, uterus, and vagina) and who do not have symptoms. Ask your health care provider if a screening pelvic exam is right for you.  If you have had past treatment for cervical cancer or a condition that could lead to cancer, you need Pap tests and screening for cancer for at least 20 years after your treatment. If Pap tests have been discontinued, your risk factors (such as having a new sexual partner) need to be reassessed to determine if screening should be resumed. Some women have medical problems that increase the chance of getting cervical cancer. In these cases, your health care provider may recommend more frequent screening and Pap tests.  The HPV test is an additional test that may be used for cervical cancer screening. The HPV test looks for the virus that can cause the cell changes on the cervix. The cells collected during the Pap test can be tested for HPV. The HPV test could be used to screen women aged 103 years and older, and should be used in women of any age who have unclear Pap test results. After the age of 73, women should have HPV testing at the same frequency as a Pap test.  Colorectal cancer can be detected and often  prevented. Most routine colorectal cancer screening begins at the age of 74 years and continues through age 12 years. However, your health care provider may recommend screening at an earlier age if you have risk factors for colon cancer. On a yearly basis, your health care provider may provide home test kits to check for hidden blood in the stool. Use of a small camera at the end of a tube, to directly examine the colon (sigmoidoscopy or colonoscopy), can detect the earliest forms of colorectal cancer. Talk to your health care provider about this at age 18, when routine screening begins. Direct exam of the colon should be repeated every 5-10 years through age 22 years, unless early forms of pre-cancerous polyps or small growths are found.  People who are at an increased risk for hepatitis B should be screened for this virus. You are considered at high risk for hepatitis B if:  You were born in a country where hepatitis B occurs often. Talk with your health care provider about which countries are considered high risk.  Your parents were born in a high-risk country and you have not received a shot to protect against hepatitis B (hepatitis B vaccine).  You have HIV or AIDS.  You use needles to inject street drugs.  You live with, or have sex with, someone who has hepatitis B.  You get hemodialysis treatment.  You take certain medicines for conditions like cancer, organ transplantation, and autoimmune conditions.  Hepatitis C blood testing is recommended for all people born from [birth year] through 1963 and any individual with known risks for hepatitis C.  Practice safe sex. Use condoms and avoid high-risk sexual practices to reduce the spread of sexually transmitted infections (STIs). STIs include gonorrhea, chlamydia, syphilis, trichomonas, herpes, HPV, and human immunodeficiency virus (HIV). Herpes, HIV, and HPV are viral illnesses that have no cure. They can result in disability, cancer, and  death.  You should be screened for sexually transmitted illnesses (STIs) including gonorrhea and chlamydia if:  You are sexually active and are younger than 24 years.  You are older than 24 years and your health care provider tells you that you are at risk for this type of infection.  Your sexual activity has changed since you were last screened and you are at an increased risk for chlamydia or gonorrhea. Ask your health care provider if you are at risk.  If you are at risk of being infected with HIV, it is recommended that you take a prescription medicine daily to prevent HIV infection. This is called preexposure prophylaxis (PrEP). You are considered at risk if:  You are a heterosexual woman, are sexually active, and are at increased risk for HIV infection.  You take drugs by injection.  You are sexually active with a partner who has HIV.  Talk with your health care provider about whether you are at high risk of being infected with HIV. If you choose to begin PrEP, you should first be tested for HIV. You should then be tested every 3 months for as long as you are taking PrEP.  Osteoporosis is a disease in which the bones lose minerals and strength with aging. This can result in serious bone fractures or breaks. The risk of osteoporosis can be identified using a bone density scan. Women ages 7 years and over and women at risk for fractures or osteoporosis should discuss screening with their health care providers. Ask your health care provider whether you should take a calcium supplement or vitamin D to reduce the rate of osteoporosis.  Menopause can be associated with physical symptoms and risks. Hormone replacement therapy is available to decrease symptoms and risks. You should talk to your health care provider about whether hormone replacement therapy is right for you.  Use sunscreen. Apply sunscreen liberally and repeatedly throughout the day. You should seek shade when your shadow is  shorter than you. Protect yourself by wearing long sleeves, pants, a wide-brimmed hat, and sunglasses year round, whenever you are outdoors.  Once a month, do a whole body skin exam, using a mirror to look at the skin on your back. Tell your health care provider of new moles, moles that have irregular borders, moles that are larger than a pencil eraser, or moles that have changed in shape or color.  Stay current with required vaccines (immunizations).  Influenza vaccine. All adults should be immunized every year.  Tetanus, diphtheria, and acellular pertussis (Td, Tdap) vaccine. Pregnant women should receive 1 dose of Tdap vaccine during each pregnancy. The dose should be obtained regardless of the length of time since the last dose. Immunization is preferred during the 27th-36th week of gestation. An adult who has not previously received Tdap or who does not know her vaccine status should receive 1 dose of Tdap. This initial dose should be followed by tetanus and diphtheria toxoids (Td) booster doses every 10 years. Adults with an unknown or incomplete history of completing a 3-dose immunization series with Td-containing vaccines should begin or complete a primary immunization series including a Tdap dose. Adults should receive a Td booster every 10 years.  Varicella vaccine. An adult without evidence of immunity to varicella should receive 2 doses or a second dose if she has previously received 1 dose. Pregnant females who do not have evidence of immunity should receive the first dose after pregnancy. This first dose should be obtained before leaving the health care facility. The second dose should be obtained 4-8 weeks after the first dose.  Human papillomavirus (  HPV) vaccine. Females aged 13-26 years who have not received the vaccine previously should obtain the 3-dose series. The vaccine is not recommended for use in pregnant females. However, pregnancy testing is not needed before receiving a dose.  If a female is found to be pregnant after receiving a dose, no treatment is needed. In that case, the remaining doses should be delayed until after the pregnancy. Immunization is recommended for any person with an immunocompromised condition through the age of 39 years if she did not get any or all doses earlier. During the 3-dose series, the second dose should be obtained 4-8 weeks after the first dose. The third dose should be obtained 24 weeks after the first dose and 16 weeks after the second dose.  Zoster vaccine. One dose is recommended for adults aged 70 years or older unless certain conditions are present.  Measles, mumps, and rubella (MMR) vaccine. Adults born before [birth year] generally are considered immune to measles and mumps. Adults born in [birth year] or later should have 1 or more doses of MMR vaccine unless there is a contraindication to the vaccine or there is laboratory evidence of immunity to each of the three diseases. A routine second dose of MMR vaccine should be obtained at least 28 days after the first dose for students attending postsecondary schools, health care workers, or international travelers. People who received inactivated measles vaccine or an unknown type of measles vaccine during 1963-1967 should receive 2 doses of MMR vaccine. People who received inactivated mumps vaccine or an unknown type of mumps vaccine before 1979 and are at high risk for mumps infection should consider immunization with 2 doses of MMR vaccine. For females of childbearing age, rubella immunity should be determined. If there is no evidence of immunity, females who are not pregnant should be vaccinated. If there is no evidence of immunity, females who are pregnant should delay immunization until after pregnancy. Unvaccinated health care workers born before [birth year] who lack laboratory evidence of measles, mumps, or rubella immunity or laboratory confirmation of disease should consider measles and mumps immunization with 2  doses of MMR vaccine or rubella immunization with 1 dose of MMR vaccine.  Pneumococcal 13-valent conjugate (PCV13) vaccine. When indicated, a person who is uncertain of her immunization history and has no record of immunization should receive the PCV13 vaccine. An adult aged 54 years or older who has certain medical conditions and has not been previously immunized should receive 1 dose of PCV13 vaccine. This PCV13 should be followed with a dose of pneumococcal polysaccharide (PPSV23) vaccine. The PPSV23 vaccine dose should be obtained at least 8 weeks after the dose of PCV13 vaccine. An adult aged 25 years or older who has certain medical conditions and previously received 1 or more doses of PPSV23 vaccine should receive 1 dose of PCV13. The PCV13 vaccine dose should be obtained 1 or more years after the last PPSV23 vaccine dose.  Pneumococcal polysaccharide (PPSV23) vaccine. When PCV13 is also indicated, PCV13 should be obtained first. All adults aged 87 years and older should be immunized. An adult younger than age 73 years who has certain medical conditions should be immunized. Any person who resides in a nursing home or long-term care facility should be immunized. An adult smoker should be immunized. People with an immunocompromised condition and certain other conditions should receive both PCV13 and PPSV23 vaccines. People with human immunodeficiency virus (HIV) infection should be immunized as soon as possible after diagnosis. Immunization during chemotherapy or radiation therapy should  be avoided. Routine use of PPSV23 vaccine is not recommended for American Indians, [place]atives, or people younger than 65 years unless there are medical conditions that require PPSV23 vaccine. When indicated, people who have unknown immunization and have no record of immunization should receive PPSV23 vaccine. One-time revaccination 5 years after the first dose of PPSV23 is recommended for people aged 19-64 years who  have chronic kidney failure, nephrotic syndrome, asplenia, or immunocompromised conditions. People who received 1-2 doses of PPSV23 before age 63 years should receive another dose of PPSV23 vaccine at age 36 years or later if at least 5 years have passed since the previous dose. Doses of PPSV23 are not needed for people immunized with PPSV23 at or after age 55 years.  Meningococcal vaccine. Adults with asplenia or persistent complement component deficiencies should receive 2 doses of quadrivalent meningococcal conjugate (MenACWY-D) vaccine. The doses should be obtained at least 2 months apart. Microbiologists working with certain meningococcal bacteria, [place] recruits, people at risk during an outbreak, and people who travel to or live in countries with a high rate of meningitis should be immunized. A first-year college student up through age 83 years who is living in a residence hall should receive a dose if she did not receive a dose on or after her 16th birthday. Adults who have certain high-risk conditions should receive one or more doses of vaccine.  Hepatitis A vaccine. Adults who wish to be protected from this disease, have certain high-risk conditions, work with hepatitis A-infected animals, work in hepatitis A research labs, or travel to or work in countries with a high rate of hepatitis A should be immunized. Adults who were previously unvaccinated and who anticipate close contact with an international adoptee during the first 60 days after arrival in the [place] from a country with a high rate of hepatitis A should be immunized.  Hepatitis B vaccine. Adults who wish to be protected from this disease, have certain high-risk conditions, may be exposed to blood or other infectious body fluids, are household contacts or sex partners of hepatitis B positive people, are clients or workers in certain care facilities, or travel to or work in countries with a high rate of hepatitis B should be  immunized.

## 2014-05-22 NOTE — Progress Notes (Signed)
Subjective:    Belinda Collins is a 80 y.o. female who presents for Medicare Initial Wellness Visit and Prolia Injection.  Patient also continues to c/o constipation.  Patient with osteoporrsis here today for Prolia injection - last injection was 10/2013 Back Pain? Yes - recently had kyphoplasty  Kyphosis? Yes  Prior fracture? Yes - sacral fracture 2013  Med(s) for Osteoporosis/Osteopenia: prolia - first injeciton 04/2013  Med(s) previously tried for Osteoporosis/Osteopenia: none    Preventive Screening-Counseling & Management  Tobacco History  Smoking status  . Never Smoker   Smokeless tobacco  . Never Used    Current Problems (verified) Patient Active Problem List   Diagnosis Date Noted  . Constipation, chronic 05/22/2014  . Osteoporosis with fracture 11/21/2013  . GERD (gastroesophageal reflux disease) 05/08/2013  . Hypertension 05/08/2013  . Elevated liver enzymes 03/13/2013  . Sacral fracture 03/13/2013  . Hypothyroidism 03/13/2013    Medications Prior to Visit Current Outpatient Prescriptions on File Prior to Visit  Medication Sig Dispense Refill  . bisacodyl (BISACODYL) 5 MG EC tablet Take 1 tablet (5 mg total) by mouth daily as needed for constipation.  30 tablet  5  . calcium carbonate (TUMS EX) 750 MG chewable tablet Chew 1 tablet by mouth daily as needed.       . Cholecalciferol (VITAMIN D) 1000 UNITS capsule Take 1 capsule (1,000 Units total) by mouth daily.  30 capsule    . Docusate Sodium (DOCULASE PO) Take by mouth.      Marland Kitchen ibuprofen (ADVIL,MOTRIN) 400 MG tablet TAKE 1 TABLET BY MOUTH EVERY FOUR HOURS AS NEEDED  90 tablet  0  . polyethylene glycol powder (GLYCOLAX/MIRALAX) powder Take 17 g by mouth daily.      . traMADol (ULTRAM) 50 MG tablet Take 1 tablet (50 mg total) by mouth 2 (two) times daily.  60 tablet  0  . esomeprazole (NEXIUM) 40 MG capsule Take 1 capsule (40 mg total) by mouth daily before breakfast.  30 capsule  5   No current facility-administered  medications on file prior to visit.    Current Medications (verified) Current Outpatient Prescriptions  Medication Sig Dispense Refill  . bisacodyl (BISACODYL) 5 MG EC tablet Take 1 tablet (5 mg total) by mouth daily as needed for constipation.  30 tablet  5  . calcium carbonate (TUMS EX) 750 MG chewable tablet Chew 1 tablet by mouth daily as needed.       . Cholecalciferol (VITAMIN D) 1000 UNITS capsule Take 1 capsule (1,000 Units total) by mouth daily.  30 capsule    . Docusate Sodium (DOCULASE PO) Take by mouth.      . furosemide (LASIX) 20 MG tablet Take 20 mg by mouth as needed.      Marland Kitchen ibuprofen (ADVIL,MOTRIN) 400 MG tablet TAKE 1 TABLET BY MOUTH EVERY FOUR HOURS AS NEEDED  90 tablet  0  . levothyroxine (SYNTHROID, LEVOTHROID) 50 MCG tablet Take 50 mcg by mouth daily before breakfast. For thyroid      . losartan-hydrochlorothiazide (HYZAAR) 50-12.5 MG per tablet Take 1 tablet by mouth daily. For blood pressure      . polyethylene glycol powder (GLYCOLAX/MIRALAX) powder Take 17 g by mouth daily.      . traMADol (ULTRAM) 50 MG tablet Take 1 tablet (50 mg total) by mouth 2 (two) times daily.  60 tablet  0  . esomeprazole (NEXIUM) 40 MG capsule Take 1 capsule (40 mg total) by mouth daily before breakfast.  30 capsule  5  .  lubiprostone (AMITIZA) 8 MCG capsule Take 1 capsule (8 mcg total) by mouth 2 (two) times daily with a meal.  14 capsule  0   No current facility-administered medications for this visit.     Allergies (verified) Ace inhibitors; Flagyl; and Penicillins   PAST HISTORY  Family History Family History  Problem Relation Age of Onset  . Cancer Mother     tumor in brain  . Tuberculosis Father     brain hemorrage  . Cancer Sister     lung  . Hypertension Sister   . Parkinson's disease Sister   . Hypertension Sister   . Parkinson's disease Brother   . Heart disease Brother     MI    Social History History  Substance Use Topics  . Smoking status: Never Smoker    . Smokeless tobacco: Never Used  . Alcohol Use: No     Are there smokers in your home (other than you)? Yes - daughter smokes when she visits but she does not live with patient  Risk Factors Current exercise habits: Home exercise routine includes stretching and resistance bands (was taught how to use in Physical therapy).    Cardiac risk factors: advanced age (older than 6 for men, 37 for women), dyslipidemia, family history of premature cardiovascular disease and hypertension.  Depression Screen (Note: if answer to either of the following is "Yes", a more complete depression screening is indicated)   Over the past 2 weeks, have you felt down, depressed or hopeless? Yes - 1 or 2 times per week  Over the past 2 weeks, have you felt little interest or pleasure in doing things? No  Have you lost interest or pleasure in daily life? No  Do you often feel hopeless? No  Do you cry easily over simple problems? Yes -occurs about once per week  Activities of Daily Living In your present state of health, do you have any difficulty performing the following activities?:  Driving? No Managing money?  No Feeding yourself? No Getting from bed to chair? No  Climbing a flight of stairs? No Preparing food and eating?: No Bathing or showering? No Getting dressed: No Getting to the toilet? No Using the toilet:No Moving around from place to place: No In the past year have you fallen or had a near fall?:No   Are you sexually active?  No  Do you have more than one partner?  No  Hearing Difficulties: No Do you often ask people to speak up or repeat themselves? No Do you experience ringing or noises in your ears? No Do you have difficulty understanding soft or whispered voices? No   Do you feel that you have a problem with memory? No  Do you often misplace items? No  Do you feel safe at home?  Yes  Cognitive Testing  Alert? Yes  Normal Appearance?Yes  Oriented to person? Yes  Place? Yes    Time? Yes  Recall of three objects?  Yes  Can perform simple calculations? Yes  Displays appropriate judgment?Yes  Can read the correct time from a watch face?Yes   Advanced Directives have been discussed with the patient? Yes  List the Names of Other Physician/Practitioners you currently use: 1.  Neurosurgeon - Glenna Fellows, MD 2.  Optomatrist - Okey Regal, OD  Indicate any recent Medical Services you may have received from other than [facility] providers in the past year (date may be approximate).  Immunization History  Administered Date(s) Administered  . Influenza,inj,Quad PF,36+ Mos  09/05/2013    Screening Tests Health Maintenance  Topic Date Due  . Tetanus/tdap  03/02/1948  . Zostavax  03/02/1989  . Pneumococcal Polysaccharide Vaccine Age 22 And Over  03/02/1994  . Influenza Vaccine  05/24/2014  . Colonoscopy  02/13/2023   Last eye exam 04/28/14 - contacted Dr Earlie Server office and they are faxing copy of exam / note.  All answers were reviewed with the patient and necessary referrals were made:  Cherre Robins, [facility]   05/22/2014   History reviewed: allergies, current medications, past family history, past medical history, past social history, past surgical history and problem list  Objective:    Body mass index is 20.89 kg/(m^2). BP 118/64  Pulse 70  Ht _0  (1.549 m)  Wt 110 lb 8 oz (50.122 kg)  BMI 20.89 kg/m2    DEXA Results  Date of Test  T-Score for AP Spine L1-L4  T-Score for Total Left Hip  T-Score for Total Right Hip   03/13/2013  -3.8  -3.6  -3.6        Assessment:   Initial Medicare Wellness Visit Osteoporosis Elevated LFTs  Plan:     During the course of the visit the patient was educated and counseled about appropriate screening and preventive services including:    Pneumococcal vaccine - (since giving Prolia today will plan to get pneumon vaccine in future)  Also checking on Zostavax price and will get in future if patient agrees    Influenza vaccine  Hepatitis B vaccine  Td vaccine -  checking on insurance coverage (since giving Prolia today will plan to get pneumon vaccine in future)  Screening electrocardiogram  Screening mammography - patient declines  Screening Pap smear and pelvic exam   Bone densitometry screening - next due 02/2015  Colorectal cancer screening - UTD  Diabetes screening  Glaucoma screening - UTD  Advanced directives: patient given [facility] packet - wants to discuss with her duaghter  Trial of amitiza 74mg 1 capsule bid  Prolia 686msq given today - next due 11/22/14  Fall prevention discussed  Since having constipation I am not recommending calcium supplement but did discussed increase non dairy sources of calcium (kale, greens, salmon, almonds)   Orders Placed This Encounter  Procedures  . HM DEXA SCAN    This external order was created through the Results Console.  . Marland KitchenMP8+EGFR  . Vit D  25 hydroxy (rtn osteoporosis monitoring)  . Thyroid Panel With TSH  . Hepatic function panel    Patient Instructions (the written plan) was given to the patient.  Medicare Attestation I have personally reviewed: The patient's medical and social history Their use of alcohol, tobacco or illicit drugs Their current medications and supplements The patient's functional ability including ADLs,fall risks, home safety risks, cognitive, and hearing and visual impairment Diet and physical activities Evidence for depression or mood disorders  The patient's weight, height, BMI, and BP/HR have been recorded in the chart.  I have made referrals, counseling, and provided education to the patient based on review of the above and I have provided the patient with a written personalized care plan for preventive services.     EcCherre RobinsPHMid State [facility] 05/22/2014

## 2014-05-23 ENCOUNTER — Telehealth: Payer: Self-pay | Admitting: Pharmacist

## 2014-05-23 LAB — BMP8+EGFR
BUN/Creatinine Ratio: 28 — ABNORMAL HIGH (ref 11–26)
BUN: 22 mg/dL (ref 8–27)
CHLORIDE: 101 mmol/L (ref 97–108)
CO2: 28 mmol/L (ref 18–29)
Calcium: 9.6 mg/dL (ref 8.7–10.3)
Creatinine, Ser: 0.78 mg/dL (ref 0.57–1.00)
GFR calc Af Amer: 80 mL/min/{1.73_m2} (ref >59)
GFR calc non Af Amer: 70 mL/min/{1.73_m2} (ref >59)
GLUCOSE: 115 mg/dL — AB (ref 65–99)
Potassium: 4.1 mmol/L (ref 3.5–5.2)
Sodium: 143 mmol/L (ref 134–144)

## 2014-05-23 LAB — THYROID PANEL WITH TSH
Free Thyroxine Index: 1.8 (ref 1.2–4.9)
T3 Uptake Ratio: 31 % (ref 24–39)
T4, Total: 5.7 ug/dL (ref 4.5–12.0)
TSH: 1.65 u[IU]/mL (ref 0.450–4.500)

## 2014-05-23 LAB — VITAMIN D 25 HYDROXY (VIT D DEFICIENCY, FRACTURES): VIT D 25 HYDROXY: 37 ng/mL (ref 30.0–100.0)

## 2014-05-23 NOTE — Telephone Encounter (Signed)
Called patient regarding labs from 05/22/14 - no changed in medications recommended.  Labs were WNL.  I also was able to get cost for 2 vaccines we discussed.  Boostrix (Tdap) is $57.92 and Zostavax is $95.  Patient will consider both and will let us know if she wises to get either.

## 2014-05-29 ENCOUNTER — Ambulatory Visit (INDEPENDENT_AMBULATORY_CARE_PROVIDER_SITE_OTHER): Payer: Medicare Other

## 2014-05-29 ENCOUNTER — Ambulatory Visit (INDEPENDENT_AMBULATORY_CARE_PROVIDER_SITE_OTHER): Payer: Medicare Other | Admitting: Family Medicine

## 2014-05-29 VITALS — BP 121/66 | HR 76 | Temp 97.1°F | Ht 61.0 in | Wt 111.0 lb

## 2014-05-29 DIAGNOSIS — M25512 Pain in left shoulder: Secondary | ICD-10-CM

## 2014-05-29 DIAGNOSIS — R071 Chest pain on breathing: Secondary | ICD-10-CM

## 2014-05-29 DIAGNOSIS — R0789 Other chest pain: Secondary | ICD-10-CM

## 2014-05-29 DIAGNOSIS — M25519 Pain in unspecified shoulder: Secondary | ICD-10-CM

## 2014-05-29 NOTE — Progress Notes (Signed)
Subjective:    Patient ID: Belinda Collins, female    DOB: 05-25-1927, 80 y.o.   MRN: [id number]  HPI Patient here today for left shoulder pain that started about 3 days ago. She does not recall any injury or fall.       Patient Active Problem List   Diagnosis Date Noted  . Constipation, chronic 05/22/2014  . Osteoporosis with fracture 11/21/2013  . GERD (gastroesophageal reflux disease) 05/08/2013  . Hypertension 05/08/2013  . Elevated liver enzymes 03/13/2013  . Sacral fracture 03/13/2013  . Hypothyroidism 03/13/2013   Outpatient Encounter Prescriptions as of 05/29/2014  Medication Sig  . bisacodyl (BISACODYL) 5 MG EC tablet Take 1 tablet (5 mg total) by mouth daily as needed for constipation.  . calcium carbonate (TUMS EX) 750 MG chewable tablet Chew 1 tablet by mouth daily as needed.   . Cholecalciferol (VITAMIN D) 1000 UNITS capsule Take 1 capsule (1,000 Units total) by mouth daily.  Tery Sanfilippo Sodium (DOCULASE PO) Take by mouth.  . esomeprazole (NEXIUM) 40 MG capsule Take 1 capsule (40 mg total) by mouth daily before breakfast.  . furosemide (LASIX) 20 MG tablet Take 20 mg by mouth as needed.  Marland Kitchen ibuprofen (ADVIL,MOTRIN) 400 MG tablet TAKE 1 TABLET BY MOUTH EVERY FOUR HOURS AS NEEDED  . levothyroxine (SYNTHROID, LEVOTHROID) 50 MCG tablet Take 50 mcg by mouth daily before breakfast. For thyroid  . losartan-hydrochlorothiazide (HYZAAR) 50-12.5 MG per tablet Take 1 tablet by mouth daily. For blood pressure  . lubiprostone (AMITIZA) 8 MCG capsule Take 1 capsule (8 mcg total) by mouth 2 (two) times daily with a meal.  . polyethylene glycol powder (GLYCOLAX/MIRALAX) powder Take 17 g by mouth daily.  . traMADol (ULTRAM) 50 MG tablet Take 1 tablet (50 mg total) by mouth 2 (two) times daily.    Review of Systems  Constitutional: Negative.   HENT: Negative.   Eyes: Negative.   Respiratory: Negative.   Cardiovascular: Negative.   Gastrointestinal: Negative.   Endocrine: Negative.     Genitourinary: Negative.   Musculoskeletal: Positive for arthralgias (left shoulder pain).  Skin: Negative.   Allergic/Immunologic: Negative.   Neurological: Negative.   Hematological: Negative.   Psychiatric/Behavioral: Negative.        Objective:   Physical Exam  Nursing note and vitals reviewed. Constitutional: She is oriented to person, place, and time. No distress.  The patient is thin and somewhat kyphotic.  HENT:  Head: Normocephalic and atraumatic.  Eyes: Conjunctivae and EOM are normal. Pupils are equal, round, and reactive to light. Right eye exhibits no discharge. Left eye exhibits no discharge. No scleral icterus.  Neck: Normal range of motion. Neck supple. No thyromegaly present.  No anterior cervical adenopathy  Cardiovascular: Normal rate, regular rhythm and normal heart sounds.   No murmur heard. Pulmonary/Chest: Effort normal and breath sounds normal. No respiratory distress. She has no wheezes. She has no rales. She exhibits no tenderness.  No axillary adenopathy, there is tenderness over the left upper and lateral anterior chest wall  Abdominal: Soft. Bowel sounds are normal. She exhibits no mass. There is no tenderness. There is no rebound and no guarding.  Musculoskeletal:  The acromioclavicular joint on the left side was nontender to palpation. Only the chest wall upper outer quadrant on the left was tender to palpation. There was no rash in this area. There were no axillary nodes. The patient's range of motion was limited due to her recent low back surgery.  Lymphadenopathy:  She has no cervical adenopathy.  Neurological: She is alert and oriented to person, place, and time.  Skin: Skin is warm and dry. No rash noted.  Psychiatric: She has a normal mood and affect. Her behavior is normal. Judgment and thought content normal.   BP 121/66  Pulse 76  Temp(Src) 97.1 F (36.2 C) (Oral)  Ht 5\' 1"  (1.549 m)  Wt 111 lb (50.349 kg)  BMI 20.98 kg/m2  [facility]  reading (PRIMARY) by  DrMoore-left shoulder--- osteopenia spurring no sign of acute fracture                                        Assessment & Plan:  1. Pain in joint, shoulder region, left - DG Shoulder Left; Future  2. Left-sided chest wall pain  Patient Instructions  Continue to take ibuprofen one twice daily after breakfast and supper for 5-7 day Use some warm wet compresses 20 minutes 3 or 4 times daily to the area of involvement If you develop any kind of rash please call the office immediately as this could mean the beginning of shingles If the pain and discomfort do not improve please call and let us know in the next 7-10 day   Nyra Capeson W. Jaaron Oleson MD

## 2014-05-29 NOTE — Patient Instructions (Addendum)
Continue to take ibuprofen one twice daily after breakfast and supper for 5-7 day Use some warm wet compresses 20 minutes 3 or 4 times daily to the area of involvement If you develop any kind of rash please call the office immediately as this could mean the beginning of shingles If the pain and discomfort do not improve please call and let us know in the next 7-10 day

## 2014-05-30 ENCOUNTER — Other Ambulatory Visit: Payer: Self-pay | Admitting: Nurse Practitioner

## 2014-06-02 ENCOUNTER — Telehealth: Payer: Self-pay

## 2014-06-02 NOTE — Telephone Encounter (Signed)
Pt aware of shoulder x-ray results 

## 2014-06-02 NOTE — Telephone Encounter (Signed)
Message copied by Roselee CulverHUMLEY, Zaelynn Fuchs on Mon Jun 02, 2014 10:10 AM ------      Message from: Ernestina PennaMOORE, DONALD W      Created: Fri May 30, 2014  3:07 PM       As per radiology report----nothing acute, old healed fracture ------

## 2014-06-24 ENCOUNTER — Other Ambulatory Visit: Payer: Self-pay | Admitting: Nurse Practitioner

## 2014-07-01 ENCOUNTER — Encounter (HOSPITAL_COMMUNITY): Payer: Self-pay | Admitting: Pharmacy Technician

## 2014-07-11 ENCOUNTER — Other Ambulatory Visit: Payer: Self-pay | Admitting: Family Medicine

## 2014-07-16 NOTE — Patient Instructions (Signed)
Your procedure is scheduled on: 07/24/2014  Report to [facility] at  820  AM.  Call this number if you have problems the morning of surgery: [phone]   Do not eat food or drink liquids :After Midnight.      Take these medicines the morning of surgery with A SIP OF WATER: nexium, synthroid, losartan, ultram   Do not wear jewelry, make-up or nail polish.  Do not wear lotions, powders, or perfumes.   Do not shave 48 hours prior to surgery.  Do not bring valuables to the hospital.  Contacts, dentures or bridgework may not be worn into surgery.  Leave suitcase in the car. After surgery it may be brought to your room.  For patients admitted to the hospital, checkout time is 11:00 AM the day of discharge.   Patients discharged the day of surgery will not be allowed to drive home.  :     Please read over the following fact sheets that you were given: Coughing and Deep Breathing, Surgical Site Infection Prevention, Anesthesia Post-op Instructions and Care and Recovery After Surgery    Cataract A cataract is a clouding of the lens of the eye. When a lens becomes cloudy, vision is reduced based on the degree and nature of the clouding. Many cataracts reduce vision to some degree. Some cataracts make people more near-sighted as they develop. Other cataracts increase glare. Cataracts that are ignored and become worse can sometimes look white. The white color can be seen through the pupil. CAUSES   Aging. However, cataracts may occur at any age, even in newborns.   Certain drugs.   Trauma to the eye.   Certain diseases such as diabetes.   Specific eye diseases such as chronic inflammation inside the eye or a sudden attack of a rare form of glaucoma.   Inherited or acquired medical problems.  SYMPTOMS   Gradual, progressive drop in vision in the affected eye.   Severe, rapid visual loss. This most often happens when trauma is the cause.  DIAGNOSIS  To detect a cataract, an eye doctor  examines the lens. Cataracts are best diagnosed with an exam of the eyes with the pupils enlarged (dilated) by drops.  TREATMENT  For an early cataract, vision may improve by using different eyeglasses or stronger lighting. If that does not help your vision, surgery is the only effective treatment. A cataract needs to be surgically removed when vision loss interferes with your everyday activities, such as driving, reading, or watching TV. A cataract may also have to be removed if it prevents examination or treatment of another eye problem. Surgery removes the cloudy lens and usually replaces it with a substitute lens (intraocular lens, IOL).  At a time when both you and your doctor agree, the cataract will be surgically removed. If you have cataracts in both eyes, only one is usually removed at a time. This allows the operated eye to heal and be out of danger from any possible problems after surgery (such as infection or poor wound healing). In rare cases, a cataract may be doing damage to your eye. In these cases, your caregiver may advise surgical removal right away. The vast majority of people who have cataract surgery have better vision afterward. HOME CARE INSTRUCTIONS  If you are not planning surgery, you may be asked to do the following:  Use different eyeglasses.   Use stronger or brighter lighting.   Ask your eye doctor about reducing your medicine dose or  changing medicines if it is thought that a medicine caused your cataract. Changing medicines does not make the cataract go away on its own.   Become familiar with your surroundings. Poor vision can lead to injury. Avoid bumping into things on the affected side. You are at a higher risk for tripping or falling.   Exercise extreme care when driving or operating machinery.   Wear sunglasses if you are sensitive to bright light or experiencing problems with glare.  SEEK IMMEDIATE MEDICAL CARE IF:   You have a worsening or sudden vision  loss.   You notice redness, swelling, or increasing pain in the eye.   You have a fever.  Document Released: 10/10/2005 Document Revised: 09/29/2011 Document Reviewed: 06/03/2011 [facility] Patient Information 2012 [place].PATIENT INSTRUCTIONS POST-ANESTHESIA  IMMEDIATELY FOLLOWING SURGERY:  Do not drive or operate machinery for the first twenty four hours after surgery.  Do not make any important decisions for twenty four hours after surgery or while taking narcotic pain medications or sedatives.  If you develop intractable nausea and vomiting or a severe headache please notify your doctor immediately.  FOLLOW-UP:  Please make an appointment with your surgeon as instructed. You do not need to follow up with anesthesia unless specifically instructed to do so.  WOUND CARE INSTRUCTIONS (if applicable):  Keep a dry clean dressing on the anesthesia/puncture wound site if there is drainage.  Once the wound has quit draining you may leave it open to air.  Generally you should leave the bandage intact for twenty four hours unless there is drainage.  If the epidural site drains for more than 36-48 hours please call the anesthesia department.  QUESTIONS?:  Please feel free to call your physician or the hospital operator if you have any questions, and they will be happy to assist you.

## 2014-07-17 ENCOUNTER — Encounter (HOSPITAL_COMMUNITY)
Admission: RE | Admit: 2014-07-17 | Discharge: 2014-07-17 | Disposition: A | Discharge status: Home / Self Care | Payer: Medicare Other | Source: Ambulatory Visit | Attending: Ophthalmology | Admitting: Ophthalmology

## 2014-07-17 ENCOUNTER — Encounter (HOSPITAL_COMMUNITY): Payer: Self-pay

## 2014-07-17 DIAGNOSIS — H269 Unspecified cataract: Secondary | ICD-10-CM | POA: Diagnosis not present

## 2014-07-17 DIAGNOSIS — Z01812 Encounter for preprocedural laboratory examination: Secondary | ICD-10-CM | POA: Diagnosis present

## 2014-07-17 HISTORY — DX: Unspecified osteoarthritis, unspecified site: M19.90

## 2014-07-17 LAB — BASIC METABOLIC PANEL
ANION GAP: 9 (ref 5–15)
BUN: 22 mg/dL (ref 6–23)
CHLORIDE: 105 meq/L (ref 96–112)
CO2: 30 meq/L (ref 19–32)
Calcium: 9.7 mg/dL (ref 8.4–10.5)
Creatinine, Ser: 0.87 mg/dL (ref 0.50–1.10)
GFR calc Af Amer: 68 mL/min — ABNORMAL LOW (ref >90)
GFR calc non Af Amer: 59 mL/min — ABNORMAL LOW (ref >90)
Glucose, Bld: 92 mg/dL (ref 70–99)
Potassium: 4.3 mEq/L (ref 3.7–5.3)
Sodium: 144 mEq/L (ref 137–147)

## 2014-07-17 LAB — HEMOGLOBIN AND HEMATOCRIT, BLOOD
HEMATOCRIT: 37.1 % (ref 36.0–46.0)
Hemoglobin: 12.2 g/dL (ref 12.0–15.0)

## 2014-07-17 NOTE — Pre-Procedure Instructions (Signed)
Patient given information to sign up for my chart at home. 

## 2014-07-23 MED ORDER — CYCLOPENTOLATE-PHENYLEPHRINE OP SOLN OPTIME - NO CHARGE
OPHTHALMIC | Status: AC
  Filled 2014-07-23: qty 2

## 2014-07-23 MED ORDER — NEOMYCIN-POLYMYXIN-DEXAMETH 3.5-10000-0.1 OP SUSP
OPHTHALMIC | Status: AC
  Filled 2014-07-23: qty 5

## 2014-07-23 MED ORDER — LIDOCAINE HCL (PF) 1 % IJ SOLN
INTRAMUSCULAR | Status: AC
  Filled 2014-07-23: qty 2

## 2014-07-23 MED ORDER — TETRACAINE HCL 0.5 % OP SOLN
OPHTHALMIC | Status: AC
  Filled 2014-07-23: qty 2

## 2014-07-23 MED ORDER — PHENYLEPHRINE HCL 2.5 % OP SOLN
OPHTHALMIC | Status: AC
  Filled 2014-07-23: qty 15

## 2014-07-23 MED ORDER — LIDOCAINE HCL 3.5 % OP GEL
OPHTHALMIC | Status: AC
  Filled 2014-07-23: qty 1

## 2014-07-24 ENCOUNTER — Encounter (HOSPITAL_COMMUNITY): Payer: Medicare Other | Admitting: Anesthesiology

## 2014-07-24 ENCOUNTER — Ambulatory Visit (HOSPITAL_COMMUNITY)
Admission: RE | Admit: 2014-07-24 | Discharge: 2014-07-24 | Disposition: A | Discharge status: Home / Self Care | Payer: Medicare Other | Source: Ambulatory Visit | Attending: Ophthalmology | Admitting: Ophthalmology

## 2014-07-24 ENCOUNTER — Ambulatory Visit (HOSPITAL_COMMUNITY): Payer: Medicare Other | Admitting: Anesthesiology

## 2014-07-24 ENCOUNTER — Encounter (HOSPITAL_COMMUNITY)
Admission: RE | Disposition: A | Discharge status: Home / Self Care | Payer: Self-pay | Source: Ambulatory Visit | Attending: Ophthalmology

## 2014-07-24 ENCOUNTER — Encounter (HOSPITAL_COMMUNITY): Payer: Self-pay | Admitting: *Deleted

## 2014-07-24 DIAGNOSIS — H268 Other specified cataract: Secondary | ICD-10-CM | POA: Insufficient documentation

## 2014-07-24 DIAGNOSIS — E039 Hypothyroidism, unspecified: Secondary | ICD-10-CM | POA: Insufficient documentation

## 2014-07-24 DIAGNOSIS — H25812 Combined forms of age-related cataract, left eye: Secondary | ICD-10-CM | POA: Diagnosis present

## 2014-07-24 DIAGNOSIS — K219 Gastro-esophageal reflux disease without esophagitis: Secondary | ICD-10-CM | POA: Diagnosis not present

## 2014-07-24 DIAGNOSIS — I10 Essential (primary) hypertension: Secondary | ICD-10-CM | POA: Insufficient documentation

## 2014-07-24 DIAGNOSIS — Z79899 Other long term (current) drug therapy: Secondary | ICD-10-CM | POA: Insufficient documentation

## 2014-07-24 HISTORY — PX: CATARACT EXTRACTION W/PHACO: SHX586

## 2014-07-24 SURGERY — PHACOEMULSIFICATION, CATARACT, WITH IOL INSERTION
Anesthesia: Monitor Anesthesia Care | Site: Eye | Laterality: Left

## 2014-07-24 MED ORDER — LIDOCAINE 3.5 % OP GEL OPTIME - NO CHARGE
OPHTHALMIC | Status: DC | PRN
  Administered 2014-07-24: 2 [drp] via OPHTHALMIC

## 2014-07-24 MED ORDER — LIDOCAINE HCL 3.5 % OP GEL
1.0000 "application " | Freq: Once | OPHTHALMIC | Status: AC
  Administered 2014-07-24: 1 via OPHTHALMIC

## 2014-07-24 MED ORDER — POVIDONE-IODINE 5 % OP SOLN
OPHTHALMIC | Status: DC | PRN
  Administered 2014-07-24: 1 via OPHTHALMIC

## 2014-07-24 MED ORDER — MIDAZOLAM HCL 2 MG/2ML IJ SOLN
1.0000 mg | INTRAMUSCULAR | Status: DC | PRN
  Administered 2014-07-24: 2 mg via INTRAVENOUS

## 2014-07-24 MED ORDER — TETRACAINE HCL 0.5 % OP SOLN
1.0000 [drp] | OPHTHALMIC | Status: AC
  Administered 2014-07-24 (×3): 1 [drp] via OPHTHALMIC

## 2014-07-24 MED ORDER — EPINEPHRINE HCL 1 MG/ML IJ SOLN
INTRAOCULAR | Status: DC | PRN
  Administered 2014-07-24: 10:00:00

## 2014-07-24 MED ORDER — FENTANYL CITRATE 0.05 MG/ML IJ SOLN
25.0000 ug | INTRAMUSCULAR | Status: AC
  Administered 2014-07-24 (×2): 25 ug via INTRAVENOUS

## 2014-07-24 MED ORDER — MIDAZOLAM HCL 2 MG/2ML IJ SOLN
INTRAMUSCULAR | Status: AC
Start: 2014-07-24 — End: 2014-07-24
  Filled 2014-07-24: qty 2

## 2014-07-24 MED ORDER — PHENYLEPHRINE HCL 2.5 % OP SOLN
1.0000 [drp] | OPHTHALMIC | Status: AC
  Administered 2014-07-24 (×3): 1 [drp] via OPHTHALMIC

## 2014-07-24 MED ORDER — NEOMYCIN-POLYMYXIN-DEXAMETH 3.5-10000-0.1 OP SUSP
OPHTHALMIC | Status: DC | PRN
  Administered 2014-07-24: 2 [drp] via OPHTHALMIC

## 2014-07-24 MED ORDER — BSS IO SOLN
INTRAOCULAR | Status: DC | PRN
  Administered 2014-07-24: 15 mL via INTRAOCULAR

## 2014-07-24 MED ORDER — FENTANYL CITRATE 0.05 MG/ML IJ SOLN
INTRAMUSCULAR | Status: AC
  Filled 2014-07-24: qty 2

## 2014-07-24 MED ORDER — EPINEPHRINE HCL 1 MG/ML IJ SOLN
INTRAMUSCULAR | Status: AC
  Filled 2014-07-24: qty 1

## 2014-07-24 MED ORDER — LIDOCAINE HCL (PF) 1 % IJ SOLN
INTRAMUSCULAR | Status: DC | PRN
  Administered 2014-07-24: .6 mL

## 2014-07-24 MED ORDER — NA HYALUR & NA CHOND-NA HYALUR 0.55-0.5 ML IO KIT
PACK | INTRAOCULAR | Status: DC | PRN
  Administered 2014-07-24: 1 via OPHTHALMIC

## 2014-07-24 MED ORDER — LACTATED RINGERS IV SOLN
INTRAVENOUS | Status: DC
  Administered 2014-07-24: 09:00:00 via INTRAVENOUS

## 2014-07-24 MED ORDER — CYCLOPENTOLATE-PHENYLEPHRINE 0.2-1 % OP SOLN
1.0000 [drp] | OPHTHALMIC | Status: AC
  Administered 2014-07-24 (×3): 1 [drp] via OPHTHALMIC

## 2014-07-24 SURGICAL SUPPLY — 33 items
CAPSULAR TENSION RING-AMO (OPHTHALMIC RELATED) IMPLANT
CLOTH BEACON ORANGE TIMEOUT ST (SAFETY) ×2 IMPLANT
EYE SHIELD UNIVERSAL CLEAR (GAUZE/BANDAGES/DRESSINGS) ×2 IMPLANT
GLOVE BIO SURGEON STRL SZ 6.5 (GLOVE) IMPLANT
GLOVE BIOGEL PI IND STRL 6.5 (GLOVE) IMPLANT
GLOVE BIOGEL PI IND STRL 7.0 (GLOVE) ×1 IMPLANT
GLOVE BIOGEL PI IND STRL 7.5 (GLOVE) IMPLANT
GLOVE BIOGEL PI INDICATOR 6.5 (GLOVE)
GLOVE BIOGEL PI INDICATOR 7.0 (GLOVE) ×1
GLOVE BIOGEL PI INDICATOR 7.5 (GLOVE)
GLOVE ECLIPSE 6.5 STRL STRAW (GLOVE) IMPLANT
GLOVE ECLIPSE 7.0 STRL STRAW (GLOVE) IMPLANT
GLOVE ECLIPSE 7.5 STRL STRAW (GLOVE) IMPLANT
GLOVE EXAM NITRILE LRG STRL (GLOVE) IMPLANT
GLOVE EXAM NITRILE MD LF STRL (GLOVE) ×2 IMPLANT
GLOVE SKINSENSE NS SZ6.5 (GLOVE)
GLOVE SKINSENSE NS SZ7.0 (GLOVE)
GLOVE SKINSENSE STRL SZ6.5 (GLOVE) IMPLANT
GLOVE SKINSENSE STRL SZ7.0 (GLOVE) IMPLANT
KIT VITRECTOMY (OPHTHALMIC RELATED) IMPLANT
PAD ARMBOARD 7.5X6 YLW CONV (MISCELLANEOUS) ×2 IMPLANT
PROC W NO LENS (INTRAOCULAR LENS)
PROC W SPEC LENS (INTRAOCULAR LENS)
PROCESS W NO LENS (INTRAOCULAR LENS) IMPLANT
PROCESS W SPEC LENS (INTRAOCULAR LENS) IMPLANT
RETRACTOR IRIS SIGHTPATH (OPHTHALMIC RELATED) IMPLANT
RING MALYGIN (MISCELLANEOUS) IMPLANT
SIGHTPATH CAT PROC W REG LENS (Ophthalmic Related) ×2 IMPLANT
SYRINGE LUER LOK 1CC (MISCELLANEOUS) ×2 IMPLANT
TAPE SURG TRANSPORE 1 IN (GAUZE/BANDAGES/DRESSINGS) ×1 IMPLANT
TAPE SURGICAL TRANSPORE 1 IN (GAUZE/BANDAGES/DRESSINGS) ×1
VISCOELASTIC ADDITIONAL (OPHTHALMIC RELATED) IMPLANT
WATER STERILE IRR 250ML POUR (IV SOLUTION) ×2 IMPLANT

## 2014-07-24 NOTE — Op Note (Signed)
Date of Admission: 07/24/2014  Date of Surgery: 07/24/2014   Pre-Op Dx: Cataract Left Eye  Post-Op Dx: Senile Nuclear  Cataract Left  Eye,  Dx Code H25.12  Surgeon: Gemma PayorKerry Huxley Vanwagoner, M.D.  Assistants: None  Anesthesia: Topical with MAC  Indications: Painless, progressive loss of vision with compromise of daily activities.  Surgery: Cataract Extraction with Intraocular lens Implant Left Eye  Discription: The patient had dilating drops and viscous lidocaine placed into the Left eye in the pre-op holding area. After transfer to the operating room, a time out was performed. The patient was then prepped and draped. Beginning with a 75 degree blade a paracentesis port was made at the surgeon's 2 o'clock position. The anterior chamber was then filled with 1% non-preserved lidocaine. This was followed by filling the anterior chamber with Provisc.  A 2.494mm keratome blade was used to make a clear corneal incision at the temporal limbus.  A bent cystatome needle was used to create a continuous tear capsulotomy. Hydrodissection was performed with balanced salt solution on a Fine canula. The lens nucleus was then removed using the phacoemulsification handpiece. Residual cortex was removed with the I&A handpiece. The anterior chamber and capsular bag were refilled with Provisc. A posterior chamber intraocular lens was placed into the capsular bag with it's injector. The implant was positioned with the Kuglan hook. The Provisc was then removed from the anterior chamber and capsular bag with the I&A handpiece. Stromal hydration of the main incision and paracentesis port was performed with BSS on a Fine canula. The wounds were tested for leak which was negative. The patient tolerated the procedure well. There were no operative complications. The patient was then transferred to the recovery room in stable condition.  Complications: None  Specimen: None  EBL: None  Prosthetic device: Hoya iSert 250, power 21.0 D, SN  [id number].

## 2014-07-24 NOTE — Anesthesia Preprocedure Evaluation (Signed)
Anesthesia Evaluation  Patient identified by MRN, date of birth, ID band Patient awake    Reviewed: Allergy & Precautions, H&P , NPO status , Patient's Chart, lab work & pertinent test results  Airway Mallampati: II TM Distance: >3 FB     Dental  (+) Teeth Intact   Pulmonary  breath sounds clear to auscultation        Cardiovascular hypertension, Pt. on medications Rhythm:Regular Rate:Normal     Neuro/Psych    GI/Hepatic GERD-  Medicated,  Endo/Other  Hypothyroidism   Renal/GU      Musculoskeletal  (+) Arthritis -,   Abdominal   Peds  Hematology   Anesthesia Other Findings   Reproductive/Obstetrics                           Anesthesia Physical Anesthesia Plan  ASA: II  Anesthesia Plan: MAC   Post-op Pain Management:    Induction: Intravenous  Airway Management Planned: Nasal Cannula  Additional Equipment:   Intra-op Plan:   Post-operative Plan:   Informed Consent: I have reviewed the patients History and Physical, chart, labs and discussed the procedure including the risks, benefits and alternatives for the proposed anesthesia with the patient or authorized representative who has indicated his/her understanding and acceptance.     Plan Discussed with:   Anesthesia Plan Comments:         Anesthesia Quick Evaluation

## 2014-07-24 NOTE — H&P (Signed)
I have reviewed the H&P, the patient was re-examined, and I have identified no interval changes in medical condition and plan of care since the history and physical of record  

## 2014-07-24 NOTE — Anesthesia Postprocedure Evaluation (Signed)
  Anesthesia Post-op Note  Patient: Belinda Collins  Procedure(s) Performed: Procedure(s) with comments: CATARACT EXTRACTION PHACO AND INTRAOCULAR LENS PLACEMENT (IOC) (Left) - CDE:  35.01  Patient Location: Short Stay  Anesthesia Type:MAC  Level of Consciousness: awake, alert  and oriented  Airway and Oxygen Therapy: Patient Spontanous Breathing  Post-op Pain: none  Post-op Assessment: Post-op Vital signs reviewed, Patient's Cardiovascular Status Stable, Respiratory Function Stable, Patent Airway and No signs of Nausea or vomiting  Post-op Vital Signs: Reviewed and stable  Last Vitals:  Filed Vitals:   07/24/14 0925  BP:   Temp:   Resp: 54    Complications: No apparent anesthesia complications

## 2014-07-24 NOTE — Discharge Instructions (Signed)

## 2014-07-24 NOTE — Transfer of Care (Signed)
Immediate Anesthesia Transfer of Care Note  Patient: Belinda Collins  Procedure(s) Performed: Procedure(s) with comments: CATARACT EXTRACTION PHACO AND INTRAOCULAR LENS PLACEMENT (IOC) (Left) - CDE:  35.01  Patient Location: Short Stay  Anesthesia Type:MAC  Level of Consciousness: awake, alert  and oriented  Airway & Oxygen Therapy: Patient Spontanous Breathing  Post-op Assessment: Report given to PACU RN  Post vital signs: Reviewed and stable  Complications: No apparent anesthesia complications

## 2014-07-25 ENCOUNTER — Encounter (HOSPITAL_COMMUNITY): Payer: Self-pay | Admitting: Ophthalmology

## 2014-07-30 ENCOUNTER — Other Ambulatory Visit: Payer: Self-pay | Admitting: Nurse Practitioner

## 2014-08-01 NOTE — Telephone Encounter (Signed)
Last filled 01/14/14, if approved please call in instead of printing, she is 87 and dont drive

## 2014-08-01 NOTE — Telephone Encounter (Signed)
Please call in tramadol with o refills

## 2014-08-01 NOTE — Telephone Encounter (Signed)
Called into [facility]

## 2014-08-12 ENCOUNTER — Telehealth: Payer: Self-pay | Admitting: Nurse Practitioner

## 2014-08-12 ENCOUNTER — Encounter (HOSPITAL_COMMUNITY): Payer: Self-pay

## 2014-08-12 NOTE — Telephone Encounter (Signed)
appt given for tomorrow per patient request 

## 2014-08-13 ENCOUNTER — Ambulatory Visit (INDEPENDENT_AMBULATORY_CARE_PROVIDER_SITE_OTHER): Payer: Medicare Other | Admitting: Nurse Practitioner

## 2014-08-13 ENCOUNTER — Encounter: Payer: Self-pay | Admitting: Nurse Practitioner

## 2014-08-13 ENCOUNTER — Encounter (HOSPITAL_COMMUNITY): Payer: Self-pay | Admitting: Pharmacy Technician

## 2014-08-13 ENCOUNTER — Encounter (HOSPITAL_COMMUNITY)
Admission: RE | Admit: 2014-08-13 | Discharge: 2014-08-13 | Disposition: A | Discharge status: Home / Self Care | Payer: Medicare Other | Source: Ambulatory Visit | Attending: Ophthalmology | Admitting: Ophthalmology

## 2014-08-13 VITALS — BP 159/60 | HR 69 | Temp 97.9°F | Wt 111.0 lb

## 2014-08-13 DIAGNOSIS — N3000 Acute cystitis without hematuria: Secondary | ICD-10-CM

## 2014-08-13 DIAGNOSIS — M545 Low back pain: Secondary | ICD-10-CM

## 2014-08-13 LAB — POCT URINALYSIS DIPSTICK
Bilirubin, UA: NEGATIVE
Glucose, UA: NEGATIVE
Ketones, UA: NEGATIVE
Nitrite, UA: NEGATIVE
Spec Grav, UA: 1.015
UROBILINOGEN UA: NEGATIVE
pH, UA: 5

## 2014-08-13 LAB — POCT UA - MICROSCOPIC ONLY
CASTS, UR, LPF, POC: NEGATIVE
CRYSTALS, UR, HPF, POC: NEGATIVE
Mucus, UA: NEGATIVE
Yeast, UA: NEGATIVE

## 2014-08-13 MED ORDER — FENTANYL CITRATE 0.05 MG/ML IJ SOLN: 25.0000 ug | INTRAMUSCULAR | Status: DC | PRN

## 2014-08-13 MED ORDER — KETOROLAC TROMETHAMINE 0.5 % OP SOLN: 1.0000 [drp] | OPHTHALMIC | Status: DC

## 2014-08-13 MED ORDER — ONDANSETRON HCL 4 MG/2ML IJ SOLN: 4.0000 mg | Freq: Once | INTRAMUSCULAR | Status: AC | PRN

## 2014-08-13 MED ORDER — SULFAMETHOXAZOLE-TMP DS 800-160 MG PO TABS: 1.0000 | ORAL_TABLET | Freq: Two times a day (BID) | ORAL | Status: DC

## 2014-08-13 NOTE — Progress Notes (Signed)
   Subjective:    Patient ID: Belinda Collins, female    DOB: 09-05-1927, 80 y.o.   MRN: [id number]  HPI  Patient is here today for back pain that started a few weeks ago. She reports the pain is  Worsening. She denies any history of kidney stone. No urinary frequency. She had a UTI in June and was given cipro.     Review of Systems  Genitourinary: Positive for frequency.  Musculoskeletal: Positive for back pain.  All other systems reviewed and are negative.      Objective:   Physical Exam  Constitutional: She is oriented to person, place, and time. She appears well-developed and well-nourished.  HENT:  Head: Normocephalic.  Neck: Normal range of motion.  Cardiovascular: Normal rate.   Pulmonary/Chest: Effort normal.  Abdominal: Soft.  Musculoskeletal: Normal range of motion.  No CVA tenderness.   Neurological: She is alert and oriented to person, place, and time.  Skin: Skin is warm.    BP 159/60  Pulse 69  Temp(Src) 97.9 F (36.6 C) (Oral)  Wt 111 lb (50.349 kg)  Results for orders placed in visit on 08/13/14  POCT URINALYSIS DIPSTICK      Result Value Ref Range   Color, UA gold     Clarity, UA clear     Glucose, UA negative     Bilirubin, UA negative     Ketones, UA negative     Spec Grav, UA 1.015     Blood, UA large     pH, UA 5.0     Protein, UA 4+     Urobilinogen, UA negative     Nitrite, UA negative     Leukocytes, UA large (3+)          Assessment & Plan:   1. Low back pain without sciatica, unspecified back pain laterality   2. Acute cystitis without hematuria     Meds ordered this encounter  Medications  . prednisoLONE acetate (PRED FORTE) 1 % ophthalmic suspension    Sig:   . PROLENSA 0.07 % SOLN    Sig:   . BESIVANCE 0.6 % SUSP    Sig:   . sulfamethoxazole-trimethoprim (BACTRIM DS) 800-160 MG per tablet    Sig: Take 1 tablet by mouth 2 (two) times daily.    Dispense:  14 tablet    Refill:  0    Order Specific Question:  Supervising  Provider    Answer:  Ernestina PennaMOORE, DONALD W [[id number]]    Increase fluid intake Wear cotton underwear Complete prescribe antibiotics Urine culture sent pending result RTO if symptoms persist or no relief   Mary-Margaret Daphine DeutscherMartin, FNP

## 2014-08-13 NOTE — Patient Instructions (Signed)

## 2014-08-14 LAB — URINE CULTURE

## 2014-08-15 MED ORDER — TETRACAINE HCL 0.5 % OP SOLN
OPHTHALMIC | Status: AC
  Filled 2014-08-15: qty 2

## 2014-08-15 MED ORDER — LIDOCAINE HCL (PF) 1 % IJ SOLN
INTRAMUSCULAR | Status: AC
  Filled 2014-08-15: qty 2

## 2014-08-15 MED ORDER — LIDOCAINE HCL 3.5 % OP GEL
OPHTHALMIC | Status: AC
  Filled 2014-08-15: qty 1

## 2014-08-15 MED ORDER — PHENYLEPHRINE HCL 2.5 % OP SOLN
OPHTHALMIC | Status: AC
  Filled 2014-08-15: qty 15

## 2014-08-15 MED ORDER — NEOMYCIN-POLYMYXIN-DEXAMETH 3.5-10000-0.1 OP SUSP
OPHTHALMIC | Status: AC
  Filled 2014-08-15: qty 5

## 2014-08-15 MED ORDER — CYCLOPENTOLATE-PHENYLEPHRINE OP SOLN OPTIME - NO CHARGE
OPHTHALMIC | Status: AC
  Filled 2014-08-15: qty 2

## 2014-08-18 ENCOUNTER — Encounter (HOSPITAL_COMMUNITY): Payer: Self-pay | Admitting: *Deleted

## 2014-08-18 ENCOUNTER — Ambulatory Visit (HOSPITAL_COMMUNITY)
Admission: RE | Admit: 2014-08-18 | Discharge: 2014-08-18 | Disposition: A | Discharge status: Home / Self Care | Payer: Medicare Other | Source: Ambulatory Visit | Attending: Ophthalmology | Admitting: Ophthalmology

## 2014-08-18 ENCOUNTER — Encounter (HOSPITAL_COMMUNITY): Payer: Medicare Other | Admitting: Anesthesiology

## 2014-08-18 ENCOUNTER — Ambulatory Visit (HOSPITAL_COMMUNITY): Payer: Medicare Other | Admitting: Anesthesiology

## 2014-08-18 ENCOUNTER — Encounter (HOSPITAL_COMMUNITY)
Admission: RE | Disposition: A | Discharge status: Home / Self Care | Payer: Self-pay | Source: Ambulatory Visit | Attending: Ophthalmology

## 2014-08-18 DIAGNOSIS — Z79899 Other long term (current) drug therapy: Secondary | ICD-10-CM | POA: Diagnosis not present

## 2014-08-18 DIAGNOSIS — E039 Hypothyroidism, unspecified: Secondary | ICD-10-CM | POA: Insufficient documentation

## 2014-08-18 DIAGNOSIS — H2511 Age-related nuclear cataract, right eye: Secondary | ICD-10-CM | POA: Insufficient documentation

## 2014-08-18 DIAGNOSIS — I10 Essential (primary) hypertension: Secondary | ICD-10-CM | POA: Insufficient documentation

## 2014-08-18 HISTORY — PX: CATARACT EXTRACTION W/PHACO: SHX586

## 2014-08-18 SURGERY — PHACOEMULSIFICATION, CATARACT, WITH IOL INSERTION
Anesthesia: Monitor Anesthesia Care | Site: Eye | Laterality: Right

## 2014-08-18 MED ORDER — MIDAZOLAM HCL 5 MG/5ML IJ SOLN
INTRAMUSCULAR | Status: DC | PRN
  Administered 2014-08-18 (×2): 1 mg via INTRAVENOUS

## 2014-08-18 MED ORDER — BSS IO SOLN
INTRAOCULAR | Status: DC | PRN
  Administered 2014-08-18: 15 mL

## 2014-08-18 MED ORDER — LACTATED RINGERS IV SOLN
INTRAVENOUS | Status: DC
  Administered 2014-08-18: 1000 mL via INTRAVENOUS

## 2014-08-18 MED ORDER — MIDAZOLAM HCL 2 MG/2ML IJ SOLN
INTRAMUSCULAR | Status: AC
  Filled 2014-08-18: qty 2

## 2014-08-18 MED ORDER — EPINEPHRINE HCL 1 MG/ML IJ SOLN
INTRAMUSCULAR | Status: AC
  Filled 2014-08-18: qty 1

## 2014-08-18 MED ORDER — PHENYLEPHRINE HCL 2.5 % OP SOLN
1.0000 [drp] | OPHTHALMIC | Status: AC
  Administered 2014-08-18 (×3): 1 [drp] via OPHTHALMIC

## 2014-08-18 MED ORDER — FENTANYL CITRATE 0.05 MG/ML IJ SOLN
25.0000 ug | INTRAMUSCULAR | Status: AC
  Administered 2014-08-18 (×2): 25 ug via INTRAVENOUS

## 2014-08-18 MED ORDER — FENTANYL CITRATE 0.05 MG/ML IJ SOLN
INTRAMUSCULAR | Status: AC
  Filled 2014-08-18: qty 2

## 2014-08-18 MED ORDER — LIDOCAINE HCL (PF) 1 % IJ SOLN
INTRAMUSCULAR | Status: DC | PRN
  Administered 2014-08-18: .8 mL

## 2014-08-18 MED ORDER — TETRACAINE HCL 0.5 % OP SOLN
1.0000 [drp] | OPHTHALMIC | Status: AC
  Administered 2014-08-18 (×3): 1 [drp] via OPHTHALMIC

## 2014-08-18 MED ORDER — EPINEPHRINE HCL 1 MG/ML IJ SOLN
INTRAMUSCULAR | Status: DC | PRN
  Administered 2014-08-18: 11:00:00

## 2014-08-18 MED ORDER — CYCLOPENTOLATE-PHENYLEPHRINE 0.2-1 % OP SOLN
1.0000 [drp] | OPHTHALMIC | Status: AC
  Administered 2014-08-18 (×3): 1 [drp] via OPHTHALMIC

## 2014-08-18 MED ORDER — LIDOCAINE HCL 3.5 % OP GEL
1.0000 | Freq: Once | OPHTHALMIC | Status: AC
Start: 2014-08-18 — End: 2014-08-18
  Administered 2014-08-18: 1 via OPHTHALMIC

## 2014-08-18 MED ORDER — MIDAZOLAM HCL 2 MG/2ML IJ SOLN
1.0000 mg | INTRAMUSCULAR | Status: DC | PRN
  Administered 2014-08-18: 2 mg via INTRAVENOUS

## 2014-08-18 MED ORDER — POVIDONE-IODINE 5 % OP SOLN
OPHTHALMIC | Status: DC | PRN
  Administered 2014-08-18: 1 via OPHTHALMIC

## 2014-08-18 MED ORDER — PROVISC 10 MG/ML IO SOLN
INTRAOCULAR | Status: DC | PRN
  Administered 2014-08-18: 0.85 mL via INTRAOCULAR

## 2014-08-18 MED ORDER — NA CHONDROIT SULF-NA HYALURON 40-30 MG/ML IO SOLN
INTRAOCULAR | Status: DC | PRN
  Administered 2014-08-18: 0.5 mL via INTRAOCULAR

## 2014-08-18 MED ORDER — NEOMYCIN-POLYMYXIN-DEXAMETH 3.5-10000-0.1 OP SUSP
OPHTHALMIC | Status: DC | PRN
  Administered 2014-08-18: 2 [drp] via OPHTHALMIC

## 2014-08-18 SURGICAL SUPPLY — 10 items
CLOTH BEACON ORANGE TIMEOUT ST (SAFETY) ×3 IMPLANT
EYE SHIELD UNIVERSAL CLEAR (GAUZE/BANDAGES/DRESSINGS) ×3 IMPLANT
GLOVE BIOGEL PI IND STRL 7.0 (GLOVE) ×2 IMPLANT
GLOVE BIOGEL PI INDICATOR 7.0 (GLOVE) ×4
PAD ARMBOARD 7.5X6 YLW CONV (MISCELLANEOUS) ×3 IMPLANT
SIGHTPATH CAT PROC W REG LENS (Ophthalmic Related) ×3 IMPLANT
SYRINGE LUER LOK 1CC (MISCELLANEOUS) ×3 IMPLANT
TAPE SURG TRANSPORE 1 IN (GAUZE/BANDAGES/DRESSINGS) ×1 IMPLANT
TAPE SURGICAL TRANSPORE 1 IN (GAUZE/BANDAGES/DRESSINGS) ×2
WATER STERILE IRR 250ML POUR (IV SOLUTION) ×3 IMPLANT

## 2014-08-18 NOTE — Anesthesia Postprocedure Evaluation (Signed)
  Anesthesia Post-op Note  Patient: Belinda Collins  Procedure(s) Performed: Procedure(s): CATARACT EXTRACTION PHACO AND INTRAOCULAR LENS PLACEMENT RIGHT EYE CDE=22.18 (Right)  Patient Location: Short Stay  Anesthesia Type:MAC  Level of Consciousness: awake, alert , oriented and patient cooperative  Airway and Oxygen Therapy: Patient Spontanous Breathing  Post-op Pain: none  Post-op Assessment: Post-op Vital signs reviewed, Patient's Cardiovascular Status Stable, Respiratory Function Stable, Patent Airway and Pain level controlled  Post-op Vital Signs: Reviewed and stable  Last Vitals:  Filed Vitals:   08/18/14 1055  BP: 142/69  Pulse:   Temp:   Resp:     Complications: No apparent anesthesia complications

## 2014-08-18 NOTE — Transfer of Care (Signed)
Immediate Anesthesia Transfer of Care Note  Patient: Belinda Collins  Procedure(s) Performed: Procedure(s): CATARACT EXTRACTION PHACO AND INTRAOCULAR LENS PLACEMENT RIGHT EYE CDE=22.18 (Right)  Patient Location: Short Stay  Anesthesia Type:MAC  Level of Consciousness: awake, alert , oriented and patient cooperative  Airway & Oxygen Therapy: Patient Spontanous Breathing  Post-op Assessment: Report given to PACU RN, Post -op Vital signs reviewed and stable and Patient moving all extremities  Post vital signs: Reviewed and stable  Complications: No apparent anesthesia complications

## 2014-08-18 NOTE — Discharge Instructions (Signed)

## 2014-08-18 NOTE — H&P (Signed)
I have reviewed the H&P, the patient was re-examined, and I have identified no interval changes in medical condition and plan of care since the history and physical of record  

## 2014-08-18 NOTE — Op Note (Signed)
Date of Admission: 08/18/2014  Date of Surgery: 08/18/2014   Pre-Op Dx: Cataract Right Eye  Post-Op Dx: Senile Nuclear Cataract Right  Eye,  Dx Code H25.11  Surgeon: Gemma PayorKerry Lutie Pickler, M.D.  Assistants: None  Anesthesia: Topical with MAC  Indications: Painless, progressive loss of vision with compromise of daily activities.  Surgery: Cataract Extraction with Intraocular lens Implant Right Eye  Discription: The patient had dilating drops and viscous lidocaine placed into the Right eye in the pre-op holding area. After transfer to the operating room, a time out was performed. The patient was then prepped and draped. Beginning with a 75 degree blade a paracentesis port was made at the surgeon's 2 o'clock position. The anterior chamber was then filled with 1% non-preserved lidocaine. This was followed by filling the anterior chamber with Viscoat.  A 2.774mm keratome blade was used to make a clear corneal incision at the temporal limbus.  A bent cystatome needle was used to create a continuous tear capsulotomy. Hydrodissection was performed with balanced salt solution on a Fine canula. The lens nucleus was then removed using the phacoemulsification handpiece. Residual cortex was removed with the I&A handpiece. The anterior chamber and capsular bag were refilled with Provisc. A posterior chamber intraocular lens was placed into the capsular bag with it's injector. The implant was positioned with the Kuglan hook. The Provisc was then removed from the anterior chamber and capsular bag with the I&A handpiece. Stromal hydration of the main incision and paracentesis port was performed with BSS on a Fine canula. The wounds were tested for leak which was negative. The patient tolerated the procedure well. There were no operative complications. The patient was then transferred to the recovery room in stable condition.  Complications: None  Specimen: None  EBL: None  Prosthetic device: Hoya iSert 250, power 22.0  D, SN [id number].

## 2014-08-18 NOTE — Anesthesia Preprocedure Evaluation (Addendum)
Anesthesia Evaluation  Patient identified by MRN, date of birth, ID band Patient awake    Reviewed: Allergy & Precautions, H&P , NPO status , Patient's Chart, lab work & pertinent test results  Airway Mallampati: II  TM Distance: >3 FB Neck ROM: full    Dental  (+) Teeth Intact   Pulmonary  breath sounds clear to auscultation        Cardiovascular hypertension, On Medications Rhythm:regular Rate:Normal     Neuro/Psych    GI/Hepatic   Endo/Other    Renal/GU      Musculoskeletal   Abdominal   Peds  Hematology   Anesthesia Other Findings   Reproductive/Obstetrics                           Anesthesia Physical Anesthesia Plan  ASA: II  Anesthesia Plan: MAC   Post-op Pain Management:    Induction: Intravenous  Airway Management Planned: Nasal Cannula  Additional Equipment:   Intra-op Plan:   Post-operative Plan:   Informed Consent: I have reviewed the patients History and Physical, chart, labs and discussed the procedure including the risks, benefits and alternatives for the proposed anesthesia with the patient or authorized representative who has indicated his/her understanding and acceptance.     Plan Discussed with:   Anesthesia Plan Comments:         Anesthesia Quick Evaluation

## 2014-08-19 ENCOUNTER — Encounter (HOSPITAL_COMMUNITY): Payer: Self-pay | Admitting: Ophthalmology

## 2014-11-17 ENCOUNTER — Ambulatory Visit (INDEPENDENT_AMBULATORY_CARE_PROVIDER_SITE_OTHER): Payer: Medicare Other | Admitting: Family

## 2014-11-17 ENCOUNTER — Telehealth: Payer: Self-pay | Admitting: Family

## 2014-11-17 ENCOUNTER — Other Ambulatory Visit: Payer: Self-pay

## 2014-11-17 VITALS — BP 138/67 | HR 70 | Temp 97.7°F | Ht 63.0 in | Wt 109.0 lb

## 2014-11-17 DIAGNOSIS — N200 Calculus of kidney: Secondary | ICD-10-CM

## 2014-11-17 DIAGNOSIS — R300 Dysuria: Secondary | ICD-10-CM

## 2014-11-17 DIAGNOSIS — R109 Unspecified abdominal pain: Secondary | ICD-10-CM

## 2014-11-17 LAB — POCT URINALYSIS DIPSTICK
Bilirubin, UA: NEGATIVE
GLUCOSE UA: NEGATIVE
KETONES UA: NEGATIVE
Nitrite, UA: NEGATIVE
PH UA: 5
Protein, UA: NEGATIVE
Spec Grav, UA: 1.02
Urobilinogen, UA: NEGATIVE

## 2014-11-17 LAB — POCT UA - MICROSCOPIC ONLY
Bacteria, U Microscopic: NEGATIVE
Casts, Ur, LPF, POC: NEGATIVE
Crystals, Ur, HPF, POC: NEGATIVE
Mucus, UA: NEGATIVE
Yeast, UA: NEGATIVE

## 2014-11-17 MED ORDER — TRAMADOL HCL 50 MG PO TABS: 25.0000 mg | ORAL_TABLET | Freq: Four times a day (QID) | ORAL | Status: DC | PRN

## 2014-11-17 NOTE — Patient Instructions (Signed)

## 2014-11-17 NOTE — Progress Notes (Signed)
   Subjective:    Patient ID: Belinda Collins, female    DOB: 1927/01/15, 81 y.o.   MRN: [id number]  Dysuria  This is a new problem. The current episode started 1 to 4 weeks ago. The problem occurs intermittently. The problem has been gradually worsening. The quality of the pain is described as shooting. The pain is at a severity of 8/10. The pain is mild.   Pt states she has had back "scratchy" pain for weeks in her right lower back that radiates to her abdomen.    Review of Systems  Constitutional: Negative.   HENT: Negative.   Eyes: Negative.   Respiratory: Negative.  Negative for shortness of breath.   Cardiovascular: Negative.  Negative for palpitations.  Gastrointestinal: Negative.   Endocrine: Negative.   Genitourinary: Positive for dysuria.  Musculoskeletal: Negative.   Neurological: Negative.  Negative for headaches.  Hematological: Negative.   Psychiatric/Behavioral: Negative.   All other systems reviewed and are negative.      Objective:   Physical Exam  Constitutional: She is oriented to person, place, and time. She appears well-developed and well-nourished. No distress.  HENT:  Head: Normocephalic and atraumatic.  Right Ear: External ear normal.  Left Ear: External ear normal.  Mouth/Throat: Oropharynx is clear and moist.  Eyes: Pupils are equal, round, and reactive to light.  Neck: Normal range of motion. Neck supple. No thyromegaly present.  Cardiovascular: Normal rate, regular rhythm, normal heart sounds and intact distal pulses.   No murmur heard. Pulmonary/Chest: Effort normal and breath sounds normal. No respiratory distress. She has no wheezes.  Abdominal: Soft. Bowel sounds are normal. She exhibits no distension. There is no tenderness.  Musculoskeletal: Normal range of motion. She exhibits edema (Trace amt in BLE). She exhibits no tenderness.  Neg for CVA tenderness   Neurological: She is alert and oriented to person, place, and time. She has normal  reflexes. No cranial nerve deficit.  Skin: Skin is warm and dry.  Psychiatric: She has a normal mood and affect. Her behavior is normal. Judgment and thought content normal.  Vitals reviewed.     BP 138/67 mmHg  Pulse 70  Temp(Src) 97.7 F (36.5 C) (Oral)  Ht _0  (1.6 m)  Wt 109 lb (49.442 kg)  BMI 19.31 kg/m2     Assessment & Plan:  1. Dysuria - POCT UA - Microscopic Only - POCT urinalysis dipstick  2. Right kidney stone -Force fluids - BMP8+EGFR - CT Abdomen Pelvis W Contrast; Future - traMADol (ULTRAM) 50 MG tablet; Take 0.5-1 tablets (25-50 mg total) by mouth every 6 (six) hours as needed for moderate pain.  Dispense: 30 tablet; Refill: 0  Evelina Dun, FNP

## 2014-11-18 LAB — BMP8+EGFR
BUN/Creatinine Ratio: 32 — ABNORMAL HIGH (ref 11–26)
BUN: 25 mg/dL (ref 8–27)
CALCIUM: 10 mg/dL (ref 8.7–10.3)
CO2: 25 mmol/L (ref 18–29)
Chloride: 102 mmol/L (ref 97–108)
Creatinine, Ser: 0.77 mg/dL (ref 0.57–1.00)
GFR calc Af Amer: 81 mL/min/{1.73_m2} (ref >59)
GFR calc non Af Amer: 71 mL/min/{1.73_m2} (ref >59)
Glucose: 85 mg/dL (ref 65–99)
Potassium: 5.1 mmol/L (ref 3.5–5.2)
SODIUM: 144 mmol/L (ref 134–144)

## 2014-11-27 ENCOUNTER — Other Ambulatory Visit: Payer: Self-pay

## 2014-11-27 MED ORDER — LOSARTAN POTASSIUM-HCTZ 50-12.5 MG PO TABS: 1.0000 | ORAL_TABLET | Freq: Every day | ORAL | Status: DC

## 2014-12-01 ENCOUNTER — Ambulatory Visit: Payer: Medicare Other | Admitting: Family

## 2014-12-01 ENCOUNTER — Other Ambulatory Visit: Payer: Self-pay | Admitting: Pharmacist

## 2014-12-01 ENCOUNTER — Telehealth: Payer: Self-pay | Admitting: Family

## 2014-12-01 ENCOUNTER — Ambulatory Visit (INDEPENDENT_AMBULATORY_CARE_PROVIDER_SITE_OTHER): Payer: Medicare Other | Admitting: Family

## 2014-12-01 ENCOUNTER — Ambulatory Visit (INDEPENDENT_AMBULATORY_CARE_PROVIDER_SITE_OTHER): Payer: Medicare Other

## 2014-12-01 ENCOUNTER — Encounter: Payer: Self-pay | Admitting: Family

## 2014-12-01 VITALS — BP 146/67 | HR 84 | Temp 97.5°F | Ht 61.0 in | Wt 109.0 lb

## 2014-12-01 DIAGNOSIS — M8080XD Other osteoporosis with current pathological fracture, unspecified site, subsequent encounter for fracture with routine healing: Secondary | ICD-10-CM

## 2014-12-01 DIAGNOSIS — R1011 Right upper quadrant pain: Secondary | ICD-10-CM

## 2014-12-01 DIAGNOSIS — R300 Dysuria: Secondary | ICD-10-CM

## 2014-12-01 LAB — POCT UA - MICROSCOPIC ONLY
Casts, Ur, LPF, POC: NEGATIVE
Crystals, Ur, HPF, POC: NEGATIVE
MUCUS UA: NEGATIVE
Yeast, UA: NEGATIVE

## 2014-12-01 LAB — POCT CBC
Granulocyte percent: 79.1 %G (ref 37–80)
HEMATOCRIT: 39.2 % (ref 37.7–47.9)
Hemoglobin: 12.2 g/dL (ref 12.2–16.2)
Lymph, poc: 1.5 (ref 0.6–3.4)
MCH, POC: 28.9 pg (ref 27–31.2)
MCHC: 31.2 g/dL — AB (ref 31.8–35.4)
MCV: 92.5 fL (ref 80–97)
MPV: 8.1 fL (ref 0–99.8)
POC Granulocyte: 7.4 — AB (ref 2–6.9)
POC LYMPH PERCENT: 15.5 %L (ref 10–50)
Platelet Count, POC: 309 10*3/uL (ref 142–424)
RBC: 4.2 M/uL (ref 4.04–5.48)
RDW, POC: 12.3 %
WBC: 9.4 10*3/uL (ref 4.6–10.2)

## 2014-12-01 LAB — POCT URINALYSIS DIPSTICK
BILIRUBIN UA: NEGATIVE
GLUCOSE UA: NEGATIVE
Ketones, UA: NEGATIVE
NITRITE UA: NEGATIVE
PROTEIN UA: NEGATIVE
Spec Grav, UA: 1.025
UROBILINOGEN UA: NEGATIVE
pH, UA: 5

## 2014-12-01 MED ORDER — DENOSUMAB 60 MG/ML ~~LOC~~ SOLN
60.0000 mg | Freq: Once | SUBCUTANEOUS | Status: AC
  Administered 2014-12-01: 60 mg via SUBCUTANEOUS

## 2014-12-01 MED ORDER — SULFAMETHOXAZOLE-TRIMETHOPRIM 800-160 MG PO TABS: 1.0000 | ORAL_TABLET | Freq: Two times a day (BID) | ORAL | Status: DC

## 2014-12-01 NOTE — Addendum Note (Signed)
Addended by: Tommas OlpHANDY, Laterrica Libman N on: 12/01/2014 05:35 PM   Modules accepted: Orders

## 2014-12-01 NOTE — Progress Notes (Signed)
Subjective:    Patient ID: Belinda Collins, female    DOB: 1927-03-06, 81 y.o.   MRN: [id number]  Back Pain This is a recurrent problem. The current episode started more than 1 year ago. The problem occurs constantly. The problem has been gradually worsening since onset. The pain is present in the lumbar spine. The quality of the pain is described as aching and shooting. The pain is at a severity of 9/10. The pain is moderate. The symptoms are aggravated by coughing and standing. Associated symptoms include bladder incontinence. Pertinent negatives include no bowel incontinence, dysuria, headaches, numbness or tingling. She has tried NSAIDs for the symptoms. The treatment provided mild relief.   *Pt was seen in office last week and was having same problem. Pt has blood and in urine and it was thought she had a kidney stone. PT had ct scan done and it was negative for a kidney stone. Pt is still having a lot of pain and discomfort. Pt has appt with nero spine surgeon next week.    Review of Systems  Constitutional: Negative.   HENT: Negative.   Eyes: Negative.   Respiratory: Negative.  Negative for shortness of breath.   Cardiovascular: Negative.  Negative for palpitations.  Gastrointestinal: Negative.  Negative for bowel incontinence.  Endocrine: Negative.   Genitourinary: Positive for bladder incontinence. Negative for dysuria.  Musculoskeletal: Positive for back pain.  Neurological: Negative.  Negative for tingling, numbness and headaches.  Hematological: Negative.   Psychiatric/Behavioral: Negative.   All other systems reviewed and are negative.      Objective:   Physical Exam  Constitutional: She is oriented to person, place, and time. She appears well-developed and well-nourished. No distress.  HENT:  Head: Normocephalic and atraumatic.  Right Ear: External ear normal.  Left Ear: External ear normal.  Nose: Nose normal.  Mouth/Throat: Oropharynx is clear and moist.  Eyes:  Pupils are equal, round, and reactive to light.  Neck: Normal range of motion. Neck supple. No thyromegaly present.  Cardiovascular: Normal rate, regular rhythm, normal heart sounds and intact distal pulses.   No murmur heard. Pulmonary/Chest: Effort normal and breath sounds normal. No respiratory distress. She has no wheezes.  Abdominal: Soft. Bowel sounds are normal. She exhibits no distension. There is no tenderness.  Musculoskeletal: Normal range of motion. She exhibits no edema or tenderness.  Neurological: She is alert and oriented to person, place, and time. She has normal reflexes. No cranial nerve deficit.  Skin: Skin is warm and dry.  Psychiatric: She has a normal mood and affect. Her behavior is normal. Judgment and thought content normal.  Vitals reviewed.  KUB- stool present Preliminary reading by Belinda Rodneyhristy Najeh Credit, FNP [facility]   BP 146/67 mmHg  Pulse 84  Temp(Src) 97.5 F (36.4 C) (Oral)  Ht 5\' 1"  (1.549 m)  Wt 109 lb (49.442 kg)  BMI 20.61 kg/m2     Assessment & Plan:  1. Osteoporosis with fracture, with routine healing, subsequent encounter - denosumab (PROLIA) injection 60 mg; Inject 60 mg into the skin once.  2. RUQ pain -Force fluids AZO over the counter X2 days RTO prn Culture pending - sulfamethoxazole-trimethoprim (BACTRIM DS,SEPTRA DS) 800-160 MG per tablet; Take 1 tablet by mouth 2 (two) times daily.  Dispense: 10 tablet; Refill: 0 - POCT CBC - POCT UA - Microscopic Only - POCT urinalysis dipstick - DG Abd 1 View; Future  *Ct results requested- Report will be sent tomorrow- Office closed at 5  Belinda Collins, [place]

## 2014-12-01 NOTE — Patient Instructions (Signed)
Back Pain, Adult Low back pain is very common. About 1 in 5 people have back pain.The cause of low back pain is rarely dangerous. The pain often gets better over time.About half of people with a sudden onset of back pain feel better in just 2 weeks. About 8 in 10 people feel better by 6 weeks.  CAUSES Some common causes of back pain include:  Strain of the muscles or ligaments supporting the spine.  Wear and tear (degeneration) of the spinal discs.  Arthritis.  Direct injury to the back. DIAGNOSIS Most of the time, the direct cause of low back pain is not known.However, back pain can be treated effectively even when the exact cause of the pain is unknown.Answering your caregiver's questions about your overall health and symptoms is one of the most accurate ways to make sure the cause of your pain is not dangerous. If your caregiver needs more information, he or she may order lab work or imaging tests (X-rays or MRIs).However, even if imaging tests show changes in your back, this usually does not require surgery. HOME CARE INSTRUCTIONS For many people, back pain returns.Since low back pain is rarely dangerous, it is often a condition that people can learn to manageon their own.   Remain active. It is stressful on the back to sit or stand in one place. Do not sit, drive, or stand in one place for more than 30 minutes at a time. Take short walks on level surfaces as soon as pain allows.Try to increase the length of time you walk each day.  Do not stay in bed.Resting more than 1 or 2 days can delay your recovery.  Do not avoid exercise or work.Your body is made to move.It is not dangerous to be active, even though your back may hurt.Your back will likely heal faster if you return to being active before your pain is gone.  Pay attention to your body when you bend and lift. Many people have less discomfortwhen lifting if they bend their knees, keep the load close to their bodies,and  avoid twisting. Often, the most comfortable positions are those that put less stress on your recovering back.  Find a comfortable position to sleep. Use a firm mattress and lie on your side with your knees slightly bent. If you lie on your back, put a pillow under your knees.  Only take over-the-counter or prescription medicines as directed by your caregiver. Over-the-counter medicines to reduce pain and inflammation are often the most helpful.Your caregiver may prescribe muscle relaxant drugs.These medicines help dull your pain so you can more quickly return to your normal activities and healthy exercise.  Put ice on the injured area.  Put ice in a plastic bag.  Place a towel between your skin and the bag.  Leave the ice on for 15-20 minutes, 03-04 times a day for the first 2 to 3 days. After that, ice and heat may be alternated to reduce pain and spasms.  Ask your caregiver about trying back exercises and gentle massage. This may be of some benefit.  Avoid feeling anxious or stressed.Stress increases muscle tension and can worsen back pain.It is important to recognize when you are anxious or stressed and learn ways to manage it.Exercise is a great option. SEEK MEDICAL CARE IF:  You have pain that is not relieved with rest or medicine.  You have pain that does not improve in 1 week.  You have new symptoms.  You are generally not feeling well. SEEK   IMMEDIATE MEDICAL CARE IF:   You have pain that radiates from your back into your legs.  You develop new bowel or bladder control problems.  You have unusual weakness or numbness in your arms or legs.  You develop nausea or vomiting.  You develop abdominal pain.  You feel faint. Document Released: 10/10/2005 Document Revised: 04/10/2012 Document Reviewed: 02/11/2014 Exit[employer] Patient Information 2015 [employer]. This information is not intended to replace advice given to you by your health care provider. Make sure you  discuss any questions you have with your health care provider.  

## 2014-12-02 NOTE — Telephone Encounter (Signed)
Spoke with daughter

## 2014-12-03 ENCOUNTER — Telehealth: Payer: Self-pay | Admitting: Family

## 2014-12-03 ENCOUNTER — Other Ambulatory Visit: Payer: Self-pay | Admitting: Family

## 2014-12-03 LAB — URINE CULTURE

## 2014-12-03 MED ORDER — CIPROFLOXACIN HCL 500 MG PO TABS: 500.0000 mg | ORAL_TABLET | Freq: Two times a day (BID) | ORAL | Status: DC

## 2014-12-03 NOTE — Telephone Encounter (Signed)
She will try eating before taking the pain medications to help resolve nausea.  We will contact her today or tomorrow with lab results.

## 2014-12-05 ENCOUNTER — Telehealth: Payer: Self-pay | Admitting: Family

## 2014-12-05 NOTE — Telephone Encounter (Signed)
Patient states that she has been nauseated and not sure if coming from the antibiotic or her tramadol. I advised patient to eat with her antibiotic and see if that helps if not to please call us back.

## 2015-01-20 ENCOUNTER — Other Ambulatory Visit: Payer: Self-pay | Admitting: Family Medicine

## 2015-01-27 ENCOUNTER — Ambulatory Visit: Payer: Medicare Other | Admitting: *Deleted

## 2015-01-29 ENCOUNTER — Encounter: Payer: Self-pay | Admitting: Family

## 2015-01-29 ENCOUNTER — Ambulatory Visit (INDEPENDENT_AMBULATORY_CARE_PROVIDER_SITE_OTHER): Payer: Medicare Other | Admitting: Family

## 2015-01-29 VITALS — BP 159/66 | HR 77 | Temp 97.0°F | Ht 61.0 in | Wt 107.2 lb

## 2015-01-29 DIAGNOSIS — R109 Unspecified abdominal pain: Secondary | ICD-10-CM

## 2015-01-29 DIAGNOSIS — R100 Acute abdomen: Secondary | ICD-10-CM

## 2015-01-29 LAB — POCT URINALYSIS DIPSTICK
BILIRUBIN UA: NEGATIVE
Glucose, UA: NEGATIVE
Ketones, UA: NEGATIVE
LEUKOCYTES UA: NEGATIVE
NITRITE UA: NEGATIVE
PROTEIN UA: NEGATIVE
Spec Grav, UA: 1.025
Urobilinogen, UA: NEGATIVE
pH, UA: 6

## 2015-01-29 LAB — POCT UA - MICROSCOPIC ONLY
BACTERIA, U MICROSCOPIC: NEGATIVE
Casts, Ur, LPF, POC: NEGATIVE
Crystals, Ur, HPF, POC: NEGATIVE
MUCUS UA: NEGATIVE
Yeast, UA: NEGATIVE

## 2015-01-29 LAB — POCT CBC
GRANULOCYTE PERCENT: 64.3 % (ref 37–80)
HEMATOCRIT: 37.4 % — AB (ref 37.7–47.9)
HEMOGLOBIN: 12 g/dL — AB (ref 12.2–16.2)
Lymph, poc: 2.2 (ref 0.6–3.4)
MCH: 29.2 pg (ref 27–31.2)
MCHC: 32.1 g/dL (ref 31.8–35.4)
MCV: 91 fL (ref 80–97)
MPV: 7.3 fL (ref 0–99.8)
POC Granulocyte: 5.2 (ref 2–6.9)
POC LYMPH PERCENT: 27.6 %L (ref 10–50)
Platelet Count, POC: 304 10*3/uL (ref 142–424)
RBC: 4.11 M/uL (ref 4.04–5.48)
RDW, POC: 12.9 %
WBC: 8.1 10*3/uL (ref 4.6–10.2)

## 2015-01-29 NOTE — Patient Instructions (Signed)
Back, Compression Fracture A compression fracture happens when a force is put upon the length of your spine. Slipping and falling on your bottom are examples of such a force. When this happens, sometimes the force is great enough to compress the building blocks (vertebral bodies) of your spine. Although this causes a lot of pain, this can usually be treated at home, unless your caregiver feels hospitalization is needed for pain control. Your backbone (spinal column) is made up of 24 main vertebral bodies in addition to the sacrum and coccyx (see illustration). These are held together by tough fibrous tissues (ligaments) and by support of your muscles. Nerve roots pass through the openings between the vertebrae. A sudden wrenching move, injury, or a fall may cause a compression fracture of one of the vertebral bodies. This may result in back pain or spread of pain into the belly (abdomen), the buttocks, and down the leg into the foot. Pain may also be created by muscle spasm alone. Large studies have been undertaken to determine the best possible course of action to help your back following injury and also to prevent future problems. The recommendations are as follows. FOLLOWING A COMPRESSION FRACTURE: Do the following only if advised by your caregiver.   If a back brace has been suggested or provided, wear it as directed.  Do not stop wearing the back brace unless instructed by your caregiver.  When allowed to return to regular activities, avoid a sedentary lifestyle. Actively exercise. Sporadic weekend binges of tennis, racquetball, or waterskiing may actually aggravate or create problems, especially if you are not in condition for that activity.  Avoid sports requiring sudden body movements until you are in condition for them. Swimming and walking are safer activities.  Maintain good posture.  Avoid obesity.  If not already done, you should have a DEXA scan. Based on the results, be treated for  osteoporosis. FOLLOWING ACUTE (SUDDEN) INJURY:  Only take over-the-counter or prescription medicines for pain, discomfort, or fever as directed by your caregiver.  Use bed rest for only the most extreme acute episode. Prolonged bed rest may aggravate your condition. Ice used for acute conditions is effective. Use a large plastic bag filled with ice. Wrap it in a towel. This also provides excellent pain relief. This may be continuous. Or use it for 30 minutes every 2 hours during acute phase, then as needed. Heat for 30 minutes prior to activities is helpful.  As soon as the acute phase (the time when your back is too painful for you to do normal activities) is over, it is important to resume normal activities and work [employer] information [employer]. Back injuries can cause potentially marked changes in lifestyle. So it is important to attack these problems aggressively.  See your caregiver for continued problems. He or she can help or refer you for appropriate exercises, physical therapy, and work hardening if needed.  If you are given narcotic medications for your condition, for the next 24 hours do not:  Drive.  Operate machinery or power tools.  Sign legal documents.  Do not drink alcohol, or take sleeping pills or other medications that may interfere with treatment. If your caregiver has given you a follow-up appointment, it is very important to keep that appointment. Not keeping the appointment could result in a chronic or permanent injury, pain, and disability. If there is any problem keeping the appointment, you must call back to this facility for assistance.  SEEK IMMEDIATE MEDICAL CARE IF:  You develop numbness,  tingling, weakness, or problems with the use of your arms or legs.  You develop severe back pain not relieved with medications.  You have changes in bowel or bladder control.  You have increasing pain in any areas of the body. Document Released: 10/10/2005 Document Revised:  02/24/2014 Document Reviewed: 05/14/2008 [facility] Patient Information 2015 [employer], [place]. This information is not intended to replace advice given to you by your health care provider. Make sure you discuss any questions you have with your health care provider. Abdominal Pain Many things can cause abdominal pain. Usually, abdominal pain is not caused by a disease and will improve without treatment. It can often be observed and treated at home. Your health care provider will do a physical exam and possibly order blood tests and X-rays to help determine the seriousness of your pain. However, in many cases, more time must pass before a clear cause of the pain can be found. Before that point, your health care provider may not know if you need more testing or further treatment. HOME CARE INSTRUCTIONS  Monitor your abdominal pain for any changes. The following actions may help to alleviate any discomfort you are experiencing:  Only take over-the-counter or prescription medicines as directed by your health care provider.  Do not take laxatives unless directed to do so by your health care provider.  Try a clear liquid diet (broth, tea, or water) as directed by your health care provider. Slowly move to a bland diet as tolerated. SEEK MEDICAL CARE IF:  You have unexplained abdominal pain.  You have abdominal pain associated with nausea or diarrhea.  You have pain when you urinate or have a bowel movement.  You experience abdominal pain that wakes you in the night.  You have abdominal pain that is worsened or improved by eating food.  You have abdominal pain that is worsened with eating fatty foods.  You have a fever. SEEK IMMEDIATE MEDICAL CARE IF:   Your pain does not go away within 2 hours.  You keep throwing up (vomiting).  Your pain is felt only in portions of the abdomen, such as the right side or the left lower portion of the abdomen.  You pass bloody or black tarry stools. MAKE SURE  YOU:  Understand these instructions.   Will watch your condition.   Will get help right away if you are not doing well or get worse.  Document Released: 07/20/2005 Document Revised: 10/15/2013 Document Reviewed: 06/19/2013 [facility] Patient Information 2015 [place], [place]. This information is not intended to replace advice given to you by your health care provider. Make sure you discuss any questions you have with your health care provider.

## 2015-01-29 NOTE — Progress Notes (Signed)
   Subjective:    Patient ID: Belinda Collins, female    DOB: 19-Dec-1926, 81 y.o.   MRN: [id number]  HPI  Pt presents to the the office for "kidney pain" that is causing LUQ and RUQ pain. This is recurrent and pt has had a CT scan in 12/01/14 that showed T11 and L5 compression fracture. Pt saw her neurosurgeon a few ago and put her on Norco 5-325 mg.    Review of Systems  Constitutional: Negative.   HENT: Negative.   Eyes: Negative.   Respiratory: Negative.  Negative for shortness of breath.   Cardiovascular: Negative.  Negative for palpitations.  Gastrointestinal: Negative.   Endocrine: Negative.   Genitourinary: Negative.   Musculoskeletal: Negative.   Neurological: Negative.  Negative for headaches.  Hematological: Negative.   Psychiatric/Behavioral: Negative.   All other systems reviewed and are negative.      Objective:   Physical Exam  Constitutional: She is oriented to person, place, and time. She appears well-developed and well-nourished. No distress.  HENT:  Head: Normocephalic and atraumatic.  Right Ear: External ear normal.  Left Ear: External ear normal.  Nose: Nose normal.  Mouth/Throat: Oropharynx is clear and moist.  Eyes: Pupils are equal, round, and reactive to light.  Neck: Normal range of motion. Neck supple. No thyromegaly present.  Cardiovascular: Normal rate, regular rhythm, normal heart sounds and intact distal pulses.   No murmur heard. Pulmonary/Chest: Effort normal and breath sounds normal. No respiratory distress. She has no wheezes.  Abdominal: Soft. Bowel sounds are normal. She exhibits no distension. There is tenderness (Generalized tenderness in abd).  Musculoskeletal: Normal range of motion. She exhibits no edema or tenderness.  Neurological: She is alert and oriented to person, place, and time. She has normal reflexes. No cranial nerve deficit.  Skin: Skin is warm and dry.  Psychiatric: She has a normal mood and affect. Her behavior is normal.  Judgment and thought content normal.  Vitals reviewed.   BP 159/66 mmHg  Pulse 77  Temp(Src) 97 F (36.1 C) (Oral)  Ht $R'5\' 1"'uy$  (1.549 m)  Wt 107 lb 3.2 oz (48.626 kg)  BMI 20.27 kg/m2  Results for orders placed or performed in visit on 01/29/15  POCT urinalysis dipstick  Result Value Ref Range   Color, UA yellow    Clarity, UA clear    Glucose, UA neg    Bilirubin, UA neg    Ketones, UA neg    Spec Grav, UA 1.025    Blood, UA large    pH, UA 6.0    Protein, UA neg    Urobilinogen, UA negative    Nitrite, UA neg    Leukocytes, UA Negative   POCT UA - Microscopic Only  Result Value Ref Range   WBC, Ur, HPF, POC 1-5    RBC, urine, microscopic 5-10    Bacteria, U Microscopic neg    Mucus, UA neg    Epithelial cells, urine per micros occ    Crystals, Ur, HPF, POC neg    Casts, Ur, LPF, POC neg    Yeast, UA neg         Assessment & Plan:  1. Abdominal pain, acute Rest -Avoid NSAID's -Take pain medication as needed -RTO prn - POCT urinalysis dipstick - POCT UA - Microscopic Only - POCT CBC - CMP14+EGFR - H Pylori, IGM, IGG, IGA AB - Lipase - Amylase  Evelina Dun, FNP

## 2015-01-31 LAB — CMP14+EGFR
ALT: 8 IU/L (ref 0–32)
AST: 20 IU/L (ref 0–40)
Albumin/Globulin Ratio: 1.3 (ref 1.1–2.5)
Albumin: 3.9 g/dL (ref 3.5–4.7)
Alkaline Phosphatase: 55 IU/L (ref 39–117)
BUN/Creatinine Ratio: 27 — ABNORMAL HIGH (ref 11–26)
BUN: 23 mg/dL (ref 8–27)
Bilirubin Total: 0.2 mg/dL (ref 0.0–1.2)
CHLORIDE: 103 mmol/L (ref 97–108)
CO2: 27 mmol/L (ref 18–29)
Calcium: 9.5 mg/dL (ref 8.7–10.3)
Creatinine, Ser: 0.86 mg/dL (ref 0.57–1.00)
GFR calc Af Amer: 71 mL/min/{1.73_m2} (ref >59)
GFR calc non Af Amer: 62 mL/min/{1.73_m2} (ref >59)
GLUCOSE: 80 mg/dL (ref 65–99)
Globulin, Total: 3.1 g/dL (ref 1.5–4.5)
Potassium: 4.2 mmol/L (ref 3.5–5.2)
Sodium: 143 mmol/L (ref 134–144)
TOTAL PROTEIN: 7 g/dL (ref 6.0–8.5)

## 2015-01-31 LAB — H PYLORI, IGM, IGG, IGA AB
H. pylori, IgA Abs: <9 units (ref 0.0–8.9)
H. pylori, IgM Abs: <9 units (ref 0.0–8.9)

## 2015-01-31 LAB — AMYLASE: Amylase: 85 U/L (ref 31–124)

## 2015-01-31 LAB — LIPASE: LIPASE: 65 U/L — AB (ref 0–59)

## 2015-02-02 ENCOUNTER — Other Ambulatory Visit: Payer: Self-pay | Admitting: Family

## 2015-02-02 ENCOUNTER — Telehealth: Payer: Self-pay | Admitting: Family

## 2015-02-02 DIAGNOSIS — R1084 Generalized abdominal pain: Secondary | ICD-10-CM

## 2015-02-02 DIAGNOSIS — R748 Abnormal levels of other serum enzymes: Secondary | ICD-10-CM

## 2015-02-04 ENCOUNTER — Telehealth: Payer: Self-pay | Admitting: Family

## 2015-02-04 MED ORDER — HYDROCODONE-ACETAMINOPHEN 5-325 MG PO TABS: 1.0000 | ORAL_TABLET | Freq: Three times a day (TID) | ORAL | Status: DC | PRN

## 2015-02-04 NOTE — Telephone Encounter (Signed)
RX ready for pick up 

## 2015-02-04 NOTE — Telephone Encounter (Signed)
Patient aware rx ready to be picked up 

## 2015-02-04 NOTE — Telephone Encounter (Signed)
Patient came by for pain medicine script.

## 2015-02-06 ENCOUNTER — Telehealth: Payer: Self-pay | Admitting: Family Medicine

## 2015-03-10 ENCOUNTER — Other Ambulatory Visit: Payer: Self-pay | Admitting: Family Medicine

## 2015-05-01 ENCOUNTER — Ambulatory Visit (INDEPENDENT_AMBULATORY_CARE_PROVIDER_SITE_OTHER): Payer: Medicare Other

## 2015-05-01 ENCOUNTER — Ambulatory Visit (INDEPENDENT_AMBULATORY_CARE_PROVIDER_SITE_OTHER): Payer: Medicare Other | Admitting: Physician Assistant

## 2015-05-01 VITALS — BP 136/71 | HR 71 | Temp 97.5°F | Ht 61.0 in | Wt 110.2 lb

## 2015-05-01 DIAGNOSIS — R1013 Epigastric pain: Secondary | ICD-10-CM | POA: Diagnosis not present

## 2015-05-01 NOTE — Patient Instructions (Signed)

## 2015-05-01 NOTE — Progress Notes (Signed)
Subjective:     Patient ID: Belinda Collins, female   DOB: May 09, 1927, 81 y.o.   MRN: [id number]  HPI Pt with intermit hx of abd pain Prev seen for same   Review of Systems  Constitutional: Negative for fever, activity change, appetite change and fatigue.  Respiratory: Negative.   Cardiovascular: Negative.   Gastrointestinal: Positive for abdominal pain. Negative for nausea, vomiting, diarrhea, constipation and abdominal distention.       Objective:   Physical Exam  Constitutional: She appears well-developed and well-nourished.  Cardiovascular: Normal rate, regular rhythm and normal heart sounds.   Pulmonary/Chest: Effort normal and breath sounds normal.  Abdominal: Soft. Bowel sounds are normal. She exhibits no distension and no mass. There is tenderness. There is no rebound and no guarding.  Epigastric TTP  Nursing note and vitals reviewed. KUB- increase in gas /stool    Assessment:     1. Abdominal pain, epigastric        Plan:     Increase fluids OTC Milk of Mag If sx continue f/u

## 2015-05-05 ENCOUNTER — Encounter: Payer: Self-pay | Admitting: Family

## 2015-05-05 ENCOUNTER — Ambulatory Visit (INDEPENDENT_AMBULATORY_CARE_PROVIDER_SITE_OTHER): Payer: Medicare Other | Admitting: Family

## 2015-05-05 VITALS — BP 156/73 | HR 73 | Temp 98.1°F | Ht 61.0 in | Wt 110.6 lb

## 2015-05-05 DIAGNOSIS — N898 Other specified noninflammatory disorders of vagina: Secondary | ICD-10-CM | POA: Diagnosis not present

## 2015-05-05 DIAGNOSIS — N3001 Acute cystitis with hematuria: Secondary | ICD-10-CM

## 2015-05-05 DIAGNOSIS — M545 Low back pain: Secondary | ICD-10-CM | POA: Diagnosis not present

## 2015-05-05 DIAGNOSIS — L298 Other pruritus: Secondary | ICD-10-CM

## 2015-05-05 LAB — POCT URINALYSIS DIPSTICK
Bilirubin, UA: NEGATIVE
Glucose, UA: NEGATIVE
KETONES UA: NEGATIVE
Nitrite, UA: NEGATIVE
Spec Grav, UA: 1.01
Urobilinogen, UA: NEGATIVE
pH, UA: 6.5

## 2015-05-05 LAB — POCT WET PREP (WET MOUNT)
KOH Wet Prep POC: POSITIVE
TRICHOMONAS WET PREP HPF POC: NEGATIVE

## 2015-05-05 LAB — POCT UA - MICROSCOPIC ONLY
Casts, Ur, LPF, POC: NEGATIVE
Crystals, Ur, HPF, POC: NEGATIVE
Mucus, UA: NEGATIVE
Yeast, UA: NEGATIVE

## 2015-05-05 MED ORDER — SULFAMETHOXAZOLE-TRIMETHOPRIM 800-160 MG PO TABS: 1.0000 | ORAL_TABLET | Freq: Two times a day (BID) | ORAL | Status: DC

## 2015-05-05 NOTE — Progress Notes (Signed)
   Subjective:    Patient ID: Belinda ShawlLena Remsen, female    DOB: 01/25/1927, 81 y.o.   MRN: [id number]  Vaginal Itching Associated symptoms include back pain, dysuria, flank pain, frequency and urgency. Pertinent negatives include no chills, headaches, hematuria, nausea or vomiting.  Back Pain Associated symptoms include dysuria. Pertinent negatives include no headaches.  Dysuria  This is a recurrent problem. The current episode started 1 to 4 weeks ago. The problem occurs every urination. The problem has been gradually worsening. The quality of the pain is described as burning. The pain is at a severity of 8/10. The pain is moderate. There has been no fever. Associated symptoms include flank pain, frequency and urgency. Pertinent negatives include no chills, discharge, hematuria, hesitancy, nausea or vomiting. She has tried increased fluids for the symptoms. The treatment provided mild relief. Her past medical history is significant for recurrent UTIs.      Review of Systems  Constitutional: Negative.  Negative for chills.  HENT: Negative.   Eyes: Negative.   Respiratory: Negative.  Negative for shortness of breath.   Cardiovascular: Negative.  Negative for palpitations.  Gastrointestinal: Negative.  Negative for nausea and vomiting.  Endocrine: Negative.   Genitourinary: Positive for dysuria, urgency, frequency and flank pain. Negative for hesitancy and hematuria.  Musculoskeletal: Positive for back pain.  Neurological: Negative.  Negative for headaches.  Hematological: Negative.   Psychiatric/Behavioral: Negative.   All other systems reviewed and are negative.      Objective:   Physical Exam  Constitutional: She is oriented to person, place, and time. She appears well-developed and well-nourished. No distress.  HENT:  Head: Normocephalic.  Eyes: Pupils are equal, round, and reactive to light.  Neck: Normal range of motion. Neck supple. No thyromegaly present.  Cardiovascular: Normal  rate, regular rhythm, normal heart sounds and intact distal pulses.   No murmur heard. Pulmonary/Chest: Effort normal and breath sounds normal. No respiratory distress. She has no wheezes.  Abdominal: Soft. Bowel sounds are normal. She exhibits no distension. There is no tenderness.  Musculoskeletal: Normal range of motion. She exhibits no edema or tenderness.  mild CVA tenderness  Neurological: She is alert and oriented to person, place, and time. She has normal reflexes. No cranial nerve deficit.  Skin: Skin is warm and dry.  Psychiatric: She has a normal mood and affect. Her behavior is normal. Judgment and thought content normal.  Vitals reviewed.     BP 156/73 mmHg  Pulse 73  Temp(Src) 98.1 F (36.7 C) (Oral)  Ht 5\' 1"  (1.549 m)  Wt 110 lb 9.6 oz (50.168 kg)  BMI 20.91 kg/m2     Assessment & Plan:  1. Vaginal itching - POCT Wet [facility] [place]  2. Vaginal irritation - POCT [facility]  3. Low back pain without sciatica, unspecified back pain laterality - POCT urinalysis dipstick - POCT UA - Microscopic Only  4. Acute cystitis with hematuria -Force fluids AZO over the counter X2 days RTO prn Culture pending - sulfamethoxazole-trimethoprim (BACTRIM DS,SEPTRA DS) 800-160 MG per tablet; Take 1 tablet by mouth 2 (two) times daily.  Dispense: 10 tablet; Refill: 0 - Urine culture  Jannifer Rodneyhristy Sian Joles, FNP

## 2015-05-05 NOTE — Patient Instructions (Signed)

## 2015-05-07 LAB — URINE CULTURE

## 2015-06-05 ENCOUNTER — Ambulatory Visit: Payer: Medicare Other

## 2015-06-05 ENCOUNTER — Other Ambulatory Visit: Payer: Self-pay | Admitting: Pharmacist

## 2015-06-05 ENCOUNTER — Ambulatory Visit (INDEPENDENT_AMBULATORY_CARE_PROVIDER_SITE_OTHER): Payer: Medicare Other | Admitting: Pharmacist

## 2015-06-05 ENCOUNTER — Encounter: Payer: Self-pay | Admitting: Pharmacist

## 2015-06-05 VITALS — BP 110/62 | HR 80 | Ht 60.25 in | Wt 108.0 lb

## 2015-06-05 DIAGNOSIS — M8080XD Other osteoporosis with current pathological fracture, unspecified site, subsequent encounter for fracture with routine healing: Secondary | ICD-10-CM | POA: Diagnosis not present

## 2015-06-05 DIAGNOSIS — Z23 Encounter for immunization: Secondary | ICD-10-CM | POA: Diagnosis not present

## 2015-06-05 DIAGNOSIS — N200 Calculus of kidney: Secondary | ICD-10-CM

## 2015-06-05 DIAGNOSIS — Z0000 Encounter for general adult medical examination without abnormal findings: Secondary | ICD-10-CM | POA: Diagnosis not present

## 2015-06-05 DIAGNOSIS — Z1211 Encounter for screening for malignant neoplasm of colon: Secondary | ICD-10-CM

## 2015-06-05 MED ORDER — TRAMADOL HCL 50 MG PO TABS: 25.0000 mg | ORAL_TABLET | Freq: Four times a day (QID) | ORAL | Status: DC | PRN

## 2015-06-05 NOTE — Telephone Encounter (Signed)
RX ready for pick up 

## 2015-06-05 NOTE — Patient Instructions (Addendum)
Belinda Collins , Thank you for taking time to come for your Medicare Wellness Visit. I appreciate your ongoing commitment to your health goals. Please review the following plan we discussed and let me know if I can assist you in the future.   Continue to drink Ensure or Boost 3 or 4 times per week . Check to make sure you are getting low sodium tomato juice.   This is a list of the screening recommended for you and due dates:  Health Maintenance  Topic Date Due  . Tetanus Vaccine  03/02/1946  . Shingles Vaccine  03/02/1989  . Pneumonia vaccines (1 of 2 - PCV13) Prevnar 13 given in office today  . DEXA scan (bone density measurement)  Due now  . Flu Shot  05/25/2015  . Colon Cancer Screening  02/13/2023      Pneumococcal Vaccine, Polyvalent suspension for injection What is this medicine? PNEUMOCOCCAL VACCINE, POLYVALENT (NEU mo KOK al vak SEEN, pol ee VEY luhnt) is a vaccine to prevent pneumococcus bacteria infection. These bacteria are a major cause of ear infections, 'Strep throat' infections, and serious pneumonia, meningitis, or blood infections worldwide. These vaccines help the body to produce antibodies (protective substances) that help your body defend against these bacteria. This vaccine is recommended for infants and young children. This vaccine will not treat an infection. This medicine may be used for other purposes; ask your health care provider or pharmacist if you have questions. COMMON BRAND NAME(S): Prevnar 13 What should I tell my health care provider before I take this medicine? They need to know if you have any of these conditions: -bleeding problems -fever -immune system problems -low platelet count in the blood -seizures -an unusual or allergic reaction to pneumococcal vaccine, diphtheria toxoid, other vaccines, latex, other medicines, foods, dyes, or preservatives -pregnant or trying to get pregnant -breast-feeding How should I use this medicine? This vaccine  is for injection into a muscle. It is given by a health care professional. A copy of Vaccine Information Statements will be given before each vaccination. Read this sheet carefully each time. The sheet may change frequently. Talk to your pediatrician regarding the use of this medicine in children. While this drug may be prescribed for children as young as 54 weeks old for selected conditions, precautions do apply. Overdosage: If you think you have taken too much of this medicine contact a poison control center or emergency room at once. NOTE: This medicine is only for you. Do not share this medicine with others. What if I miss a dose? It is important not to miss your dose. Call your doctor or health care professional if you are unable to keep an appointment. What may interact with this medicine? -medicines for cancer chemotherapy -medicines that suppress your immune function -medicines that treat or prevent blood clots like warfarin, enoxaparin, and dalteparin -steroid medicines like prednisone or cortisone This list may not describe all possible interactions. Give your health care provider a list of all the medicines, herbs, non-prescription drugs, or dietary supplements you use. Also tell them if you smoke, drink alcohol, or use illegal drugs. Some items may interact with your medicine. What should I watch for while using this medicine? Mild fever and pain should go away in 3 days or less. Report any unusual symptoms to your doctor or health care professional. What side effects may I notice from receiving this medicine? Side effects that you should report to your doctor or health care professional as soon as possible: -  allergic reactions like skin rash, itching or hives, swelling of the face, lips, or tongue -breathing problems -confused -fever over 102 degrees F -pain, tingling, numbness in the hands or feet -seizures -unusual bleeding or bruising -unusual muscle weakness Side effects that  usually do not require medical attention (report to your doctor or health care professional if they continue or are bothersome): -aches and pains -diarrhea -fever of 102 degrees F or less -headache -irritable -loss of appetite -pain, tender at site where injected -trouble sleeping This list may not describe all possible side effects. Call your doctor for medical advice about side effects. You may report side effects to FDA at 1-800-FDA-1088. Where should I keep my medicine? This does not apply. This vaccine is given in a clinic, pharmacy, doctor's office, or other health care setting and will not be stored at home. NOTE: This sheet is a summary. It may not cover all possible information. If you have questions about this medicine, talk to your doctor, pharmacist, or health care provider.  2015, [employer]vier/Gold Standard. (2008-12-23 10:17:22)   Preventive Care for Adults A healthy lifestyle and preventive care can promote health and wellness. Preventive health guidelines for women include the following key practices.  A routine yearly physical is a good way to check with your health care provider about your health and preventive screening. It is a chance to share any concerns and updates on your health and to receive a thorough exam.  Visit your dentist for a routine exam and preventive care every 6 months. Brush your teeth twice a day and floss once a day. Good oral hygiene prevents tooth decay and gum disease.  The frequency of eye exams is based on your age, health, family medical history, use of contact lenses, and other factors. Follow your health care provider's recommendations for frequency of eye exams.  Eat a healthy diet. Foods like vegetables, fruits, whole grains, low-fat dairy products, and lean protein foods contain the nutrients you need without too many calories. Decrease your intake of foods high in solid fats, added sugars, and salt. Eat the right amount of calories for  you.Get information about a proper diet from your health care provider, if necessary.  Regular physical exercise is one of the most important things you can do for your health. Most adults should get at least 150 minutes of moderate-intensity exercise (any activity that increases your heart rate and causes you to sweat) each week. In addition, most adults need muscle-strengthening exercises on 2 or more days a week.  Maintain a healthy weight. The body mass index (BMI) is a screening tool to identify possible weight problems. It provides an estimate of body fat based on height and weight. Your health care provider can find your BMI and can help you achieve or maintain a healthy weight.For adults 20 years and older:  A BMI below 18.5 is considered underweight.  A BMI of 18.5 to 24.9 is normal.  A BMI of 25 to 29.9 is considered overweight.  A BMI of 30 and above is considered obese.  Maintain normal blood lipids and cholesterol levels by exercising and minimizing your intake of saturated fat. Eat a balanced diet with plenty of fruit and vegetables. Blood tests for lipids and cholesterol should begin at age 76 and be repeated every 5 years. If your lipid or cholesterol levels are high, you are over 50, or you are at high risk for heart disease, you may need your cholesterol levels checked more frequently.Ongoing high lipid  and cholesterol levels should be treated with medicines if diet and exercise are not working.  If you smoke, find out from your health care provider how to quit. If you do not use tobacco, do not start.  Lung cancer screening is recommended for adults aged 10-80 years who are at high risk for developing lung cancer because of a history of smoking. A yearly low-dose CT scan of the lungs is recommended for people who have at least a 30-pack-year history of smoking and are a current smoker or have quit within the past 15 years. A pack year of smoking is smoking an average of 1 pack  of cigarettes a day for 1 year (for example: 1 pack a day for 30 years or 2 packs a day for 15 years). Yearly screening should continue until the smoker has stopped smoking for at least 15 years. Yearly screening should be stopped for people who develop a health problem that would prevent them from having lung cancer treatment.  If you are pregnant, do not drink alcohol. If you are breastfeeding, be very cautious about drinking alcohol. If you are not pregnant and choose to drink alcohol, do not have more than 1 drink per day. One drink is considered to be 12 ounces (355 mL) of beer, 5 ounces (148 mL) of wine, or 1.5 ounces (44 mL) of liquor.  Avoid use of street drugs. Do not share needles with anyone. Ask for help if you need support or instructions about stopping the use of drugs.  High blood pressure causes heart disease and increases the risk of stroke. Your blood pressure should be checked at least every 1 to 2 years. Ongoing high blood pressure should be treated with medicines if weight loss and exercise do not work.  If you are 82-64 years old, ask your health care provider if you should take aspirin to prevent strokes.  Diabetes screening involves taking a blood sample to check your fasting blood sugar level. This should be done once every 3 years, after age 53, if you are within normal weight and without risk factors for diabetes. Testing should be considered at a younger age or be carried out more frequently if you are overweight and have at least 1 risk factor for diabetes.  Breast cancer screening is essential preventive care for women. You should practice "breast self-awareness." This means understanding the normal appearance and feel of your breasts and may include breast self-examination. Any changes detected, no matter how small, should be reported to a health care provider. Women in their 21s and 30s should have a clinical breast exam (CBE) by a health care provider as part of a regular  health exam every 1 to 3 years. After age 20, women should have a CBE every year. Starting at age 6, women should consider having a mammogram (breast X-ray test) every year. Women who have a family history of breast cancer should talk to their health care provider about genetic screening. Women at a high risk of breast cancer should talk to their health care providers about having an MRI and a mammogram every year.  Breast cancer gene (BRCA)-related cancer risk assessment is recommended for women who have family members with BRCA-related cancers. BRCA-related cancers include breast, ovarian, tubal, and peritoneal cancers. Having family members with these cancers may be associated with an increased risk for harmful changes (mutations) in the breast cancer genes BRCA1 and BRCA2. Results of the assessment will determine the need for genetic counseling and BRCA1  and BRCA2 testing.  Routine pelvic exams to screen for cancer are no longer recommended for nonpregnant women who are considered low risk for cancer of the pelvic organs (ovaries, uterus, and vagina) and who do not have symptoms. Ask your health care provider if a screening pelvic exam is right for you.  If you have had past treatment for cervical cancer or a condition that could lead to cancer, you need Pap tests and screening for cancer for at least 20 years after your treatment. If Pap tests have been discontinued, your risk factors (such as having a new sexual partner) need to be reassessed to determine if screening should be resumed. Some women have medical problems that increase the chance of getting cervical cancer. In these cases, your health care provider may recommend more frequent screening and Pap tests.  The HPV test is an additional test that may be used for cervical cancer screening. The HPV test looks for the virus that can cause the cell changes on the cervix. The cells collected during the Pap test can be tested for HPV. The HPV test  could be used to screen women aged 27 years and older, and should be used in women of any age who have unclear Pap test results. After the age of 3, women should have HPV testing at the same frequency as a Pap test.  Colorectal cancer can be detected and often prevented. Most routine colorectal cancer screening begins at the age of 20 years and continues through age 77 years. However, your health care provider may recommend screening at an earlier age if you have risk factors for colon cancer. On a yearly basis, your health care provider may provide home test kits to check for hidden blood in the stool. Use of a small camera at the end of a tube, to directly examine the colon (sigmoidoscopy or colonoscopy), can detect the earliest forms of colorectal cancer. Talk to your health care provider about this at age 31, when routine screening begins. Direct exam of the colon should be repeated every 5-10 years through age 83 years, unless early forms of pre-cancerous polyps or small growths are found.  People who are at an increased risk for hepatitis B should be screened for this virus. You are considered at high risk for hepatitis B if:  You were born in a country where hepatitis B occurs often. Talk with your health care provider about which countries are considered high risk.  Your parents were born in a high-risk country and you have not received a shot to protect against hepatitis B (hepatitis B vaccine).  You have HIV or AIDS.  You use needles to inject street drugs.  You live with, or have sex with, someone who has hepatitis B.  You get hemodialysis treatment.  You take certain medicines for conditions like cancer, organ transplantation, and autoimmune conditions.  Hepatitis C blood testing is recommended for all people born from [birth year] through 1963 and any individual with known risks for hepatitis C.  Practice safe sex. Use condoms and avoid high-risk sexual practices to reduce the spread of  sexually transmitted infections (STIs). STIs include gonorrhea, chlamydia, syphilis, trichomonas, herpes, HPV, and human immunodeficiency virus (HIV). Herpes, HIV, and HPV are viral illnesses that have no cure. They can result in disability, cancer, and death.  You should be screened for sexually transmitted illnesses (STIs) including gonorrhea and chlamydia if:  You are sexually active and are younger than 24 years.  You are older than  24 years and your health care provider tells you that you are at risk for this type of infection.  Your sexual activity has changed since you were last screened and you are at an increased risk for chlamydia or gonorrhea. Ask your health care provider if you are at risk.  If you are at risk of being infected with HIV, it is recommended that you take a prescription medicine daily to prevent HIV infection. This is called preexposure prophylaxis (PrEP). You are considered at risk if:  You are a heterosexual woman, are sexually active, and are at increased risk for HIV infection.  You take drugs by injection.  You are sexually active with a partner who has HIV.  Talk with your health care provider about whether you are at high risk of being infected with HIV. If you choose to begin PrEP, you should first be tested for HIV. You should then be tested every 3 months for as long as you are taking PrEP.  Osteoporosis is a disease in which the bones lose minerals and strength with aging. This can result in serious bone fractures or breaks. The risk of osteoporosis can be identified using a bone density scan. Women ages 62 years and over and women at risk for fractures or osteoporosis should discuss screening with their health care providers. Ask your health care provider whether you should take a calcium supplement or vitamin D to reduce the rate of osteoporosis.  Menopause can be associated with physical symptoms and risks. Hormone replacement therapy is available to  decrease symptoms and risks. You should talk to your health care provider about whether hormone replacement therapy is right for you.  Use sunscreen. Apply sunscreen liberally and repeatedly throughout the day. You should seek shade when your shadow is shorter than you. Protect yourself by wearing long sleeves, pants, a wide-brimmed hat, and sunglasses year round, whenever you are outdoors.  Once a month, do a whole body skin exam, using a mirror to look at the skin on your back. Tell your health care provider of new moles, moles that have irregular borders, moles that are larger than a pencil eraser, or moles that have changed in shape or color.  Stay current with required vaccines (immunizations).  Influenza vaccine. All adults should be immunized every year.  Tetanus, diphtheria, and acellular pertussis (Td, Tdap) vaccine. Pregnant women should receive 1 dose of Tdap vaccine during each pregnancy. The dose should be obtained regardless of the length of time since the last dose. Immunization is preferred during the 27th-36th week of gestation. An adult who has not previously received Tdap or who does not know her vaccine status should receive 1 dose of Tdap. This initial dose should be followed by tetanus and diphtheria toxoids (Td) booster doses every 10 years. Adults with an unknown or incomplete history of completing a 3-dose immunization series with Td-containing vaccines should begin or complete a primary immunization series including a Tdap dose. Adults should receive a Td booster every 10 years.  Varicella vaccine. An adult without evidence of immunity to varicella should receive 2 doses or a second dose if she has previously received 1 dose. Pregnant females who do not have evidence of immunity should receive the first dose after pregnancy. This first dose should be obtained before leaving the health care facility. The second dose should be obtained 4-8 weeks after the first dose.  Human  papillomavirus (HPV) vaccine. Females aged 13-26 years who have not received the vaccine previously should  obtain the 3-dose series. The vaccine is not recommended for use in pregnant females. However, pregnancy testing is not needed before receiving a dose. If a female is found to be pregnant after receiving a dose, no treatment is needed. In that case, the remaining doses should be delayed until after the pregnancy. Immunization is recommended for any person with an immunocompromised condition through the age of 33 years if she did not get any or all doses earlier. During the 3-dose series, the second dose should be obtained 4-8 weeks after the first dose. The third dose should be obtained 24 weeks after the first dose and 16 weeks after the second dose.  Zoster vaccine. One dose is recommended for adults aged 42 years or older unless certain conditions are present.  Measles, mumps, and rubella (MMR) vaccine. Adults born before [birth year] generally are considered immune to measles and mumps. Adults born in [birth year] or later should have 1 or more doses of MMR vaccine unless there is a contraindication to the vaccine or there is laboratory evidence of immunity to each of the three diseases. A routine second dose of MMR vaccine should be obtained at least 28 days after the first dose for students attending postsecondary schools, health care workers, or international travelers. People who received inactivated measles vaccine or an unknown type of measles vaccine during 1963-1967 should receive 2 doses of MMR vaccine. People who received inactivated mumps vaccine or an unknown type of mumps vaccine before 1979 and are at high risk for mumps infection should consider immunization with 2 doses of MMR vaccine. For females of childbearing age, rubella immunity should be determined. If there is no evidence of immunity, females who are not pregnant should be vaccinated. If there is no evidence of immunity, females who are pregnant  should delay immunization until after pregnancy. Unvaccinated health care workers born before [birth year] who lack laboratory evidence of measles, mumps, or rubella immunity or laboratory confirmation of disease should consider measles and mumps immunization with 2 doses of MMR vaccine or rubella immunization with 1 dose of MMR vaccine.  Pneumococcal 13-valent conjugate (PCV13) vaccine. When indicated, a person who is uncertain of her immunization history and has no record of immunization should receive the PCV13 vaccine. An adult aged 76 years or older who has certain medical conditions and has not been previously immunized should receive 1 dose of PCV13 vaccine. This PCV13 should be followed with a dose of pneumococcal polysaccharide (PPSV23) vaccine. The PPSV23 vaccine dose should be obtained at least 8 weeks after the dose of PCV13 vaccine. An adult aged 9 years or older who has certain medical conditions and previously received 1 or more doses of PPSV23 vaccine should receive 1 dose of PCV13. The PCV13 vaccine dose should be obtained 1 or more years after the last PPSV23 vaccine dose.  Pneumococcal polysaccharide (PPSV23) vaccine. When PCV13 is also indicated, PCV13 should be obtained first. All adults aged 76 years and older should be immunized. An adult younger than age 20 years who has certain medical conditions should be immunized. Any person who resides in a nursing home or long-term care facility should be immunized. An adult smoker should be immunized. People with an immunocompromised condition and certain other conditions should receive both PCV13 and PPSV23 vaccines. People with human immunodeficiency virus (HIV) infection should be immunized as soon as possible after diagnosis. Immunization during chemotherapy or radiation therapy should be avoided. Routine use of PPSV23 vaccine is not recommended for American Indians, [place]atives,  or people younger than 65 years unless there are medical conditions  that require PPSV23 vaccine. When indicated, people who have unknown immunization and have no record of immunization should receive PPSV23 vaccine. One-time revaccination 5 years after the first dose of PPSV23 is recommended for people aged 19-64 years who have chronic kidney failure, nephrotic syndrome, asplenia, or immunocompromised conditions. People who received 1-2 doses of PPSV23 before age 44 years should receive another dose of PPSV23 vaccine at age 23 years or later if at least 5 years have passed since the previous dose. Doses of PPSV23 are not needed for people immunized with PPSV23 at or after age 70 years.  Meningococcal vaccine. Adults with asplenia or persistent complement component deficiencies should receive 2 doses of quadrivalent meningococcal conjugate (MenACWY-D) vaccine. The doses should be obtained at least 2 months apart. Microbiologists working with certain meningococcal bacteria, [employer] recruits, people at risk during an outbreak, and people who travel to or live in countries with a high rate of meningitis should be immunized. A first-year college student up through age 38 years who is living in a residence hall should receive a dose if she did not receive a dose on or after her 16th birthday. Adults who have certain high-risk conditions should receive one or more doses of vaccine.  Hepatitis A vaccine. Adults who wish to be protected from this disease, have certain high-risk conditions, work with hepatitis A-infected animals, work in hepatitis A research labs, or travel to or work in countries with a high rate of hepatitis A should be immunized. Adults who were previously unvaccinated and who anticipate close contact with an international adoptee during the first 60 days after arrival in the [place] from a country with a high rate of hepatitis A should be immunized.  Hepatitis B vaccine. Adults who wish to be protected from this disease, have certain high-risk conditions, may  be exposed to blood or other infectious body fluids, are household contacts or sex partners of hepatitis B positive people, are clients or workers in certain care facilities, or travel to or work in countries with a high rate of hepatitis B should be immunized.  Haemophilus influenzae type b (Hib) vaccine. A previously unvaccinated person with asplenia or sickle cell disease or having a scheduled splenectomy should receive 1 dose of Hib vaccine. Regardless of previous immunization, a recipient of a hematopoietic stem cell transplant should receive a 3-dose series 6-12 months after her successful transplant. Hib vaccine is not recommended for adults with HIV infection. Preventive Services / Frequency Ages 18 years and over  Blood pressure check.** / Every 1 to 2 years.  Lipid and cholesterol check.** / Every 5 years beginning at age 59 years. More frequently if heart disease or if elevated  Lung cancer screening. / Every year if you are aged 46-80 years and have a 30-pack-year history of smoking and currently smoke or have quit within the past 15 years. Yearly screening is stopped once you have quit smoking for at least 15 years or develop a health problem that would prevent you from having lung cancer treatment.  Clinical breast exam.** / Every year after age 77 years.  BRCA-related cancer risk assessment.** / For women who have family members with a BRCA-related cancer (breast, ovarian, tubal, or peritoneal cancers).  Mammogram.** / Every year beginning at age 77 years and continuing for as long as you are in good health. Consult with your health care provider.  Pap test.** / Every 3 years starting  at age 34 years through age 106 or 34 years with 3 consecutive normal Pap tests. Testing can be stopped between 65 and 70 years with 3 consecutive normal Pap tests and no abnormal Pap or HPV tests in the past 10 years.  HPV screening.** / Every 3 years from ages 12 years through ages 74 or 69 years  with a history of 3 consecutive normal Pap tests. Testing can be stopped between 65 and 70 years with 3 consecutive normal Pap tests and no abnormal Pap or HPV tests in the past 10 years.  Fecal occult blood test (FOBT) of stool. / Every year beginning at age 70 years and continuing until age 53 years. You may not need to do this test if you get a colonoscopy every 10 years.  Flexible sigmoidoscopy or colonoscopy.** / Every 5 years for a flexible sigmoidoscopy or every 10 years for a colonoscopy beginning at age 53 years and continuing until age 56 years.  Hepatitis C blood test.** / For all people born from [birth year] through 1963 and any individual with known risks for hepatitis C.  Osteoporosis screening.** / A one-time screening for women ages 63 years and over and women at risk for fractures or osteoporosis.  Skin self-exam. / Monthly.  Influenza vaccine. / Every year.  Tetanus, diphtheria, and acellular pertussis (Tdap/Td) vaccine.** / 1 dose of Td every 10 years.  Varicella vaccine.** / Consult your health care provider.  Zoster vaccine.** / 1 dose for adults aged 14 years or older.  Pneumococcal 13-valent conjugate (PCV13) vaccine.** / Consult your health care provider.  Pneumococcal polysaccharide (PPSV23) vaccine.** / 1 dose for all adults aged 69 years and older.  Meningococcal vaccine.** / Consult your health care provider.  Hepatitis A vaccine.** / Consult your health care provider.  Hepatitis B vaccine.** / Consult your health care provider.  Haemophilus influenzae type b (Hib) vaccine.** / Consult your health care provider. ** Family history and personal history of risk and conditions may change your health care provider's recommendations. Document Released: 12/06/2001 Document Revised: 02/24/2014 Document Reviewed: 03/07/2011 [facility] Patient Information 2015 [place], [place]. This information is not intended to replace advice given to you by your health care provider. Make  sure you discuss any questions you have with your health care provider.

## 2015-06-05 NOTE — Progress Notes (Signed)
Patient ID: Belinda Collins, female   DOB: 21-Aug-1927, 81 y.o.   MRN: [id number]    Subjective:   Belinda Collins is a 81 y.o. female who presents for a subsequent Medicare Annual Wellness Visit and to have DEXA.   Belinda Collins is widowed.  She lives alone and is able to perform most of her ADLs independently.  She occasionally needs assistance with some  Housework and her daughter helps with this.   She has been receiving Prolia injections every 6 months and is due her next injection now but she is concerned about back pain and that Prolia may be worsening this.  She has an appt with Dr Channing Mutters to evaluate back pain next week.  She is also due to have DEXA today  Current Medications (verified) Outpatient Encounter Prescriptions as of 06/05/2015  Medication Sig  . bisacodyl (BISACODYL) 5 MG EC tablet Take 1 tablet (5 mg total) by mouth daily as needed for constipation.  . Cholecalciferol (VITAMIN D) 1000 UNITS capsule Take 1 capsule (1,000 Units total) by mouth daily.  . furosemide (LASIX) 20 MG tablet Take 20 mg by mouth daily as needed for fluid.   Marland Kitchen levothyroxine (SYNTHROID, LEVOTHROID) 50 MCG tablet TAKE 1 TABLET BY MOUTH DAILY  . losartan-hydrochlorothiazide (HYZAAR) 50-12.5 MG per tablet Take 1 tablet by mouth daily.  . polyethylene glycol powder (GLYCOLAX/MIRALAX) powder Take 17 g by mouth daily as needed for mild constipation.   . traMADol (ULTRAM) 50 MG tablet Take 0.5-1 tablets (25-50 mg total) by mouth every 6 (six) hours as needed for moderate pain.  . [DISCONTINUED] esomeprazole (NEXIUM) 40 MG capsule Take 40 mg by mouth daily at 12 noon.  . [DISCONTINUED] HYDROcodone-acetaminophen (NORCO/VICODIN) 5-325 MG per tablet Take 1 tablet by mouth every 8 (eight) hours as needed. (Patient not taking: Reported on 06/05/2015)  . [DISCONTINUED] sulfamethoxazole-trimethoprim (BACTRIM DS,SEPTRA DS) 800-160 MG per tablet Take 1 tablet by mouth 2 (two) times daily. (Patient not taking: Reported on 06/05/2015)    Patient reports that she is not taking hydrocodone / APAP because it was causing too much drowsiness.  No facility-administered encounter medications on file as of 06/05/2015.    Allergies (verified) Ace inhibitors; Flagyl; and Penicillins   History: Past Medical History  Diagnosis Date  . Hypothyroidism   . Hypertension   . Sacral fracture   . Osteoporosis   . Vitamin D deficiency   . GERD (gastroesophageal reflux disease)   . Constipation   . Cataract   . Abdominal pain   . Arthritis   . Osteoporosis    Past Surgical History  Procedure Laterality Date  . Fixation kyphoplasty lumbar spine    . Abdominal hysterectomy Bilateral   . Cataract extraction w/phaco Left 07/24/2014    Procedure: CATARACT EXTRACTION PHACO AND INTRAOCULAR LENS PLACEMENT (IOC);  Surgeon: Gemma Payor, MD;  Location: [facility];  Service: Ophthalmology;  Laterality: Left;  CDE:  35.01  . Cataract extraction w/phaco Right 08/18/2014    Procedure: CATARACT EXTRACTION PHACO AND INTRAOCULAR LENS PLACEMENT RIGHT EYE CDE=22.18;  Surgeon: Gemma Payor, MD;  Location: [facility];  Service: Ophthalmology;  Laterality: Right;  . Eye surgery     Family History  Problem Relation Age of Onset  . Cancer Mother     tumor in brain  . Tuberculosis Father     brain hemorrage  . Cancer Sister     lung  . Hypertension Sister   . Parkinson's disease Sister   . Hypertension Sister   .  Parkinson's disease Brother   . Heart disease Brother     MI  . Heart disease Brother   . Peptic Ulcer Son    Social History   Occupational History  . Not on file.   Social History Main Topics  . Smoking status: Never Smoker   . Smokeless tobacco: Never Used  . Alcohol Use: No  . Drug Use: No  . Sexual Activity: No    Do you feel safe at home?  Yes  Dietary issues and exercise activities: Current Exercise Habits:: Exercise is limited by, Limited by:: orthopedic condition(s)  Current Dietary habits:  Tries to eat healthy.  Drink  Ensure and Boost from time to time for weight.  V8 juice a few times per week.   Objective:    Today's Vitals   06/05/15 0909  BP: 110/62  Pulse: 80  Height: 5' 0.25" (1.53 m)  Weight: 108 lb (48.988 kg)   Body mass index is 20.93 kg/(m^2).  Activities of Daily Living In your present state of health, do you have any difficulty performing the following activities: 06/05/2015 08/12/2014  Hearing? N N  Vision? N N  Difficulty concentrating or making decisions? N N  Walking or climbing stairs? Y N  Dressing or bathing? N N  Doing errands, shopping? N N  Preparing Food and eating ? N -  Using the Toilet? N -  In the past six months, have you accidently leaked urine? Y -  Do you have problems with loss of bowel control? N -  Managing your Medications? N -  Managing your Finances? N -  Housekeeping or managing your Housekeeping? N -    Are there smokers in your home (other than you)? No   Cardiac Risk Factors include: advanced age (>61men, >16 women);family history of premature cardiovascular disease;hypertension;sedentary lifestyle  Depression Screen PHQ 2/9 Scores 06/05/2015 05/05/2015 05/22/2014 04/22/2014  PHQ - 2 Score 1 0 1 1    Fall Risk Fall Risk  06/05/2015 05/05/2015 05/22/2014 04/22/2014  Falls in the past year? No No No No    Cognitive Function: MMSE - Mini Mental State Exam 06/05/2015  Orientation to time 5  Orientation to Place 5  Registration 3  Attention/ Calculation 5  Recall 1  Language- name 2 objects 2  Language- repeat 1  Language- follow 3 step command 3  Language- read & follow direction 1  Write a sentence 1  Copy design 1  Total score 28    Immunizations and Health Maintenance Immunization History  Administered Date(s) Administered  . Influenza,inj,Quad PF,36+ Mos 09/05/2013  . Pneumococcal Conjugate-13 06/05/2015   Health Maintenance Due  Topic Date Due  . TETANUS/TDAP  03/02/1948  . ZOSTAVAX  03/02/1989  . PNA vac Low Risk Adult (1 of 2  - PCV13) 03/02/1994  . DEXA SCAN  03/14/2015  . INFLUENZA VACCINE  05/25/2015    Patient Care Team: Junie Spencer, FNP as PCP - General (Nurse Practitioner) Smitty Cords, OD as Consulting Physician (Optometry) Trey Sailors, MD as Attending Physician (Neurosurgery)  Indicate any recent Medical Services you may have received from other than [facility] providers in the past year (date may be approximate).    Assessment:    Annual Wellness Visit  Osteoporosis Back pain   Screening Tests Health Maintenance  Topic Date Due  . TETANUS/TDAP  03/02/1948  . ZOSTAVAX  03/02/1989  . PNA vac Low Risk Adult (1 of 2 - PCV13) 03/02/1994  . DEXA SCAN  03/14/2015  .  INFLUENZA VACCINE  05/25/2015  . COLONOSCOPY  02/13/2023        Plan:   During the course of the visit Gazelle was educated and counseled about the following appropriate screening and preventive services:   Vaccines to include Pneumoccal, Influenza, Hepatitis B, Td, Zostavax - Prevnar 13 given in office today;  Checked price of tDAP ($59) and Zostavax($95) and patient declined due to cost  Colorectal cancer screening - colonoscopy last was normal 01/2013.  FOBT given in office today  Cardiovascular disease screening - no recent EKG but no cardiac complaints.  BP at goal today.  Due to have lipids rechecked but patient not fasting today.    Diabetes screening - UTD  Bone Denisty / Osteoporosis Screening - ordered today but patient was unable to lay flat on table to get DEXA.  She has follow up appt for back pain with Dr Channing Mutters her neurosurgeon 06/10/15.  Will plan to retry DEXA in future  Mammogram - patient declines  Glaucoma screening / Eye Exam - reminded patient to get recheck year.y  Nutrition counseling - continue with Boost or Ensure 3-4 times per week. Recommended she make sure she is getting low sodium tomato juice.  Advanced Directives - patient declined  Not sent to patients PCP regarding pain.   Patient Instructions  (the written plan) were given to the patient.   Henrene Pastor, PHARMD, CPP  06/05/2015

## 2015-06-05 NOTE — Telephone Encounter (Signed)
Daughter aware pain script ready for pick up here at [facility].

## 2015-06-05 NOTE — Telephone Encounter (Signed)
Patient was last given Rx for hydrocodone/APAP but she states that this makes her too drowsy.  She would like to go back to tramadol.  She is scheduled to see Dr Channing Mutters her neurosurgeon next week.

## 2015-06-14 ENCOUNTER — Other Ambulatory Visit: Payer: Self-pay | Admitting: Family Medicine

## 2015-06-15 NOTE — Telephone Encounter (Signed)
Last TSH 04/2014 

## 2015-06-20 ENCOUNTER — Other Ambulatory Visit: Payer: Self-pay | Admitting: Family Medicine

## 2015-06-22 ENCOUNTER — Other Ambulatory Visit: Payer: Medicare Other

## 2015-06-22 DIAGNOSIS — Z1212 Encounter for screening for malignant neoplasm of rectum: Secondary | ICD-10-CM

## 2015-06-24 LAB — FECAL OCCULT BLOOD, IMMUNOCHEMICAL: Fecal Occult Bld: NEGATIVE

## 2015-07-13 ENCOUNTER — Encounter: Payer: Self-pay | Admitting: Family Medicine

## 2015-07-13 ENCOUNTER — Other Ambulatory Visit: Payer: Self-pay | Admitting: *Deleted

## 2015-07-13 ENCOUNTER — Ambulatory Visit (INDEPENDENT_AMBULATORY_CARE_PROVIDER_SITE_OTHER): Payer: Medicare Other

## 2015-07-13 ENCOUNTER — Ambulatory Visit (INDEPENDENT_AMBULATORY_CARE_PROVIDER_SITE_OTHER): Payer: Medicare Other | Admitting: Family Medicine

## 2015-07-13 VITALS — BP 112/63 | HR 72 | Temp 97.0°F | Ht 60.25 in | Wt 112.0 lb

## 2015-07-13 DIAGNOSIS — M25512 Pain in left shoulder: Secondary | ICD-10-CM | POA: Diagnosis not present

## 2015-07-13 DIAGNOSIS — N200 Calculus of kidney: Secondary | ICD-10-CM | POA: Diagnosis not present

## 2015-07-13 DIAGNOSIS — M542 Cervicalgia: Secondary | ICD-10-CM

## 2015-07-13 MED ORDER — TRAMADOL HCL 50 MG PO TABS: 25.0000 mg | ORAL_TABLET | Freq: Four times a day (QID) | ORAL | Status: AC | PRN

## 2015-07-13 NOTE — Patient Instructions (Addendum)
Use warm compressions to area 20 mins  four times a day. We will call you when the final xray report comes back and then we will discuss MRI

## 2015-07-13 NOTE — Progress Notes (Signed)
Subjective:    Patient ID: Belinda Collins, female    DOB: 10-23-1927, 81 y.o.   MRN: [id number]  HPI Patient comes into the office today complaining with left shoulder pain that hurts when she is laying down or moves it. She states the pain from her shoulder moves down into her chest. She is also complaining with the back of her neck swelling and painful at times. She is suppose to have a MRI of her back on Thursday with Dr.  Temple Pacini office in [place]. Patient describes no injury. She is seen the neurosurgeon and he is planning to do the MRI of her low back in 3 days.    Review of Systems  Constitutional: Negative.   HENT: Negative.   Eyes: Negative.   Respiratory: Negative.   Cardiovascular: Negative.   Gastrointestinal: Negative.   Endocrine: Negative.   Genitourinary: Negative.   Musculoskeletal: Positive for neck pain.       Left shoulder pain   Skin: Negative.   Allergic/Immunologic: Negative.   Neurological: Negative.   Hematological: Negative.   Psychiatric/Behavioral: Negative.      Patient Active Problem List   Diagnosis Date Noted  . Constipation, chronic 05/22/2014  . Osteoporosis with fracture 11/21/2013  . GERD (gastroesophageal reflux disease) 05/08/2013  . Hypertension 05/08/2013  . Elevated liver enzymes 03/13/2013  . Sacral fracture 03/13/2013  . Hypothyroidism 03/13/2013   Outpatient Encounter Prescriptions as of 07/13/2015  Medication Sig  . bisacodyl (BISACODYL) 5 MG EC tablet Take 1 tablet (5 mg total) by mouth daily as needed for constipation.  . Cholecalciferol (VITAMIN D) 1000 UNITS capsule Take 1 capsule (1,000 Units total) by mouth daily.  Marland Kitchen ENSURE PLUS (ENSURE PLUS) LIQD Take 237 mLs by mouth daily.  . furosemide (LASIX) 20 MG tablet Take 20 mg by mouth daily as needed for fluid.   Marland Kitchen levothyroxine (SYNTHROID, LEVOTHROID) 50 MCG tablet TAKE 1 TABLET BY MOUTH DAILY  . losartan-hydrochlorothiazide (HYZAAR) 50-12.5 MG per tablet Take 1 tablet by mouth  daily.  . magnesium hydroxide (MILK OF MAGNESIA) 400 MG/5ML suspension Take by mouth daily as needed for mild constipation.  . polyethylene glycol powder (GLYCOLAX/MIRALAX) powder Take 17 g by mouth daily as needed for mild constipation.   . traMADol (ULTRAM) 50 MG tablet Take 0.5-1 tablets (25-50 mg total) by mouth every 6 (six) hours as needed for moderate pain.  . [DISCONTINUED] levothyroxine (SYNTHROID, LEVOTHROID) 50 MCG tablet TAKE 1 TABLET BY MOUTH DAILY   No facility-administered encounter medications on file as of 07/13/2015.      Objective:   Physical Exam  Constitutional: She is oriented to person, place, and time. No distress.  Patient is small framed and alert.  HENT:  Head: Normocephalic and atraumatic.  Eyes: Conjunctivae and EOM are normal. Pupils are equal, round, and reactive to light. Right eye exhibits no discharge. Left eye exhibits no discharge. No scleral icterus.  Neck: Neck supple. No tracheal deviation present. No thyromegaly present.  Range of motion is slightly limited and the patient is tender in the left paracervical lower spine area. There was no tenderness elicited in the suprascapular area or the before meals joint area.  Musculoskeletal: She exhibits tenderness. She exhibits no edema.  There is some definite muscle tightening in this area due to the pain she is having. Limited range of motion left shoulder with palpable tenderness and the left lower paracervical spine area. There was no suprascapular tenderness or before meals joint tenderness on the side. There  is no rash.  Neurological: She is alert and oriented to person, place, and time.  Skin: Skin is warm and dry. No rash noted.  Psychiatric: She has a normal mood and affect. Her behavior is normal. Judgment and thought content normal.   BP 112/63 mmHg  Pulse 72  Temp(Src) 97 F (36.1 C) (Oral)  Ht 5' 0.25" (1.53 m)  Wt 112 lb (50.803 kg)  BMI 21.70 kg/m2  [facility] reading (PRIMARY) by  Dr.  Shawn Stall shoulder---Osteoporitic with no acute fracture noted.                                         Assessment & Plan:  1. Left shoulder pain -Osteoporotic changes on x-ray - DG Shoulder Left; Future  2. Right kidney stone -This medication was refilled not necessarily for the kidney stone but for the left shoulder pain to take as needed if she is on to take one half tablet that she should try extra strength Tylenol first. - traMADol (ULTRAM) 50 MG tablet; Take 0.5-1 tablets (25-50 mg total) by mouth every 6 (six) hours as needed for moderate pain.  Dispense: 60 tablet; Refill: 0  Patient Instructions  Use warm compressions to area 20 mins  four times a day. We will call you when the final xray report comes back and then we will discuss MRI   Nyra Capes MD

## 2015-07-14 ENCOUNTER — Ambulatory Visit: Payer: Medicare Other | Admitting: Family Medicine

## 2015-07-17 ENCOUNTER — Other Ambulatory Visit: Payer: Self-pay | Admitting: Family

## 2015-08-03 ENCOUNTER — Other Ambulatory Visit: Payer: Self-pay | Admitting: Nurse Practitioner

## 2015-09-16 ENCOUNTER — Other Ambulatory Visit: Payer: Self-pay | Admitting: Family

## 2015-09-16 NOTE — Telephone Encounter (Signed)
This is okay to refill 1. Please give the patient appointment to have her come in and get her thyroid profile done

## 2015-09-16 NOTE — Telephone Encounter (Signed)
Last seen 07/13/15  DWM  Last thyroid level 05/22/14

## 2015-09-22 ENCOUNTER — Ambulatory Visit (INDEPENDENT_AMBULATORY_CARE_PROVIDER_SITE_OTHER): Payer: Medicare Other | Admitting: Family

## 2015-09-22 ENCOUNTER — Encounter: Payer: Self-pay | Admitting: Family

## 2015-09-22 VITALS — BP 136/68 | HR 73 | Temp 97.9°F | Ht 60.0 in | Wt 114.6 lb

## 2015-09-22 DIAGNOSIS — K219 Gastro-esophageal reflux disease without esophagitis: Secondary | ICD-10-CM | POA: Diagnosis not present

## 2015-09-22 DIAGNOSIS — Z23 Encounter for immunization: Secondary | ICD-10-CM

## 2015-09-22 DIAGNOSIS — K5900 Constipation, unspecified: Secondary | ICD-10-CM | POA: Diagnosis not present

## 2015-09-22 DIAGNOSIS — I10 Essential (primary) hypertension: Secondary | ICD-10-CM

## 2015-09-22 DIAGNOSIS — E039 Hypothyroidism, unspecified: Secondary | ICD-10-CM | POA: Diagnosis not present

## 2015-09-22 DIAGNOSIS — K5909 Other constipation: Secondary | ICD-10-CM

## 2015-09-22 DIAGNOSIS — M8080XD Other osteoporosis with current pathological fracture, unspecified site, subsequent encounter for fracture with routine healing: Secondary | ICD-10-CM

## 2015-09-22 NOTE — Progress Notes (Signed)
Subjective:    Patient ID: Belinda Collins, female    DOB: 1926/11/17, 81 y.o.   MRN: [id number]  Pt presents to the office today for chronic follow up.  Hypertension This is a chronic problem. The current episode started more than 1 year ago. The problem has been resolved since onset. The problem is controlled. Pertinent negatives include no anxiety, headaches, palpitations, peripheral edema or shortness of breath. Risk factors for coronary artery disease include post-menopausal state and sedentary lifestyle. Past treatments include diuretics and alpha 1 blockers. The current treatment provides moderate improvement. Hypertensive end-organ damage includes renovascular disease and a thyroid problem. There is no history of kidney disease, CAD/MI or heart failure.  Constipation This is a chronic problem. The current episode started more than 1 year ago. The problem has been resolved since onset. Her stool frequency is 4 to 5 times per week. The patient is on a high fiber diet. She does not exercise regularly. There has been adequate water intake. Pertinent negatives include no diarrhea, flatus, nausea, vomiting or weight loss. Risk factors include immobility. She has tried laxatives and stool softeners for the symptoms. The treatment provided moderate relief.  Thyroid Problem Presents for follow-up visit. Symptoms include constipation. Patient reports no depressed mood, diarrhea, hair loss, hoarse voice, leg swelling, palpitations or weight loss. The symptoms have been stable. Past treatments include levothyroxine. The treatment provided significant relief. There is no history of heart failure.      Review of Systems  Constitutional: Negative.  Negative for weight loss.  HENT: Negative.  Negative for hoarse voice.   Eyes: Negative.   Respiratory: Negative.  Negative for shortness of breath.   Cardiovascular: Negative.  Negative for palpitations.  Gastrointestinal: Positive for constipation. Negative  for nausea, vomiting, diarrhea and flatus.  Endocrine: Negative.   Genitourinary: Negative.   Musculoskeletal: Negative.   Neurological: Negative.  Negative for headaches.  Hematological: Negative.   Psychiatric/Behavioral: Negative.   All other systems reviewed and are negative.      Objective:   Physical Exam  Constitutional: She is oriented to person, place, and time. She appears well-developed and well-nourished. No distress.  HENT:  Head: Normocephalic and atraumatic.  Right Ear: External ear normal.  Left Ear: External ear normal.  Nose: Nose normal.  Mouth/Throat: Oropharynx is clear and moist.  Eyes: Pupils are equal, round, and reactive to light.  Neck: Normal range of motion. Neck supple. No thyromegaly present.  Cardiovascular: Normal rate, regular rhythm, normal heart sounds and intact distal pulses.   No murmur heard. Pulmonary/Chest: Effort normal and breath sounds normal. No respiratory distress. She has no wheezes.  Abdominal: Soft. Bowel sounds are normal. She exhibits no distension. There is no tenderness.  Musculoskeletal: Normal range of motion. She exhibits edema (trace amt in bilateral ankles). She exhibits no tenderness.  Neurological: She is alert and oriented to person, place, and time. She has normal reflexes. No cranial nerve deficit.  Skin: Skin is warm and dry.  Psychiatric: She has a normal mood and affect. Her behavior is normal. Judgment and thought content normal.  Vitals reviewed.     BP 136/68 mmHg  Pulse 73  Temp(Src) 97.9 F (36.6 C) (Oral)  Ht 5' (1.524 m)  Wt 114 lb 9.6 oz (51.982 kg)  BMI 22.38 kg/m2     Assessment & Plan:  1. Essential hypertension - CMP14+EGFR  2. Constipation, chronic - CMP14+EGFR  3. Gastroesophageal reflux disease, esophagitis presence not specified - CMP14+EGFR  4. Hypothyroidism,  unspecified hypothyroidism type - CMP14+EGFR - Thyroid Panel With TSH  5. Osteoporosis with fracture, with routine  healing, subsequent encounter - CMP14+EGFR - VITAMIN D 25 Hydroxy (Vit-D Deficiency, Fractures)   Continue all meds Labs pending Health Maintenance reviewed- Flu vaccine given today Diet and exercise encouraged RTO 6 months  Evelina Dun, FNP

## 2015-09-22 NOTE — Patient Instructions (Signed)
Health Maintenance, Female Adopting a healthy lifestyle and getting preventive care can go a long way to promote health and wellness. Talk with your health care provider about what schedule of regular examinations is right for you. This is a good chance for you to check in with your provider about disease prevention and staying healthy. In between checkups, there are plenty of things you can do on your own. Experts have done a lot of research about which lifestyle changes and preventive measures are most likely to keep you healthy. Ask your health care provider for more information. WEIGHT AND DIET  Eat a healthy diet  Be sure to include plenty of vegetables, fruits, low-fat dairy products, and lean protein.  Do not eat a lot of foods high in solid fats, added sugars, or salt.  Get regular exercise. This is one of the most important things you can do for your health.  Most adults should exercise for at least 150 minutes each week. The exercise should increase your heart rate and make you sweat (moderate-intensity exercise).  Most adults should also do strengthening exercises at least twice a week. This is in addition to the moderate-intensity exercise.  Maintain a healthy weight  Body mass index (BMI) is a measurement that can be used to identify possible weight problems. It estimates body fat based on height and weight. Your health care provider can help determine your BMI and help you achieve or maintain a healthy weight.  For females 20 years of age and older:   A BMI below 18.5 is considered underweight.  A BMI of 18.5 to 24.9 is normal.  A BMI of 25 to 29.9 is considered overweight.  A BMI of 30 and above is considered obese.  Watch levels of cholesterol and blood lipids  You should start having your blood tested for lipids and cholesterol at 79 years of age, then have this test every 5 years.  You may need to have your cholesterol levels checked more often if:  Your lipid  or cholesterol levels are high.  You are older than 79 years of age.  You are at high risk for heart disease.  CANCER SCREENING   Lung Cancer  Lung cancer screening is recommended for adults 55-80 years old who are at high risk for lung cancer because of a history of smoking.  A yearly low-dose CT scan of the lungs is recommended for people who:  Currently smoke.  Have quit within the past 15 years.  Have at least a 30-pack-year history of smoking. A pack year is smoking an average of one pack of cigarettes a day for 1 year.  Yearly screening should continue until it has been 15 years since you quit.  Yearly screening should stop if you develop a health problem that would prevent you from having lung cancer treatment.  Breast Cancer  Practice breast self-awareness. This means understanding how your breasts normally appear and feel.  It also means doing regular breast self-exams. Let your health care provider know about any changes, no matter how small.  If you are in your 20s or 30s, you should have a clinical breast exam (CBE) by a health care provider every 1-3 years as part of a regular health exam.  If you are 40 or older, have a CBE every year. Also consider having a breast X-ray (mammogram) every year.  If you have a family history of breast cancer, talk to your health care provider about genetic screening.  If you   are at high risk for breast cancer, talk to your health care provider about having an MRI and a mammogram every year.  Breast cancer gene (BRCA) assessment is recommended for women who have family members with BRCA-related cancers. BRCA-related cancers include:  Breast.  Ovarian.  Tubal.  Peritoneal cancers.  Results of the assessment will determine the need for genetic counseling and BRCA1 and BRCA2 testing. Cervical Cancer Your health care provider may recommend that you be screened regularly for cancer of the pelvic organs (ovaries, uterus, and  vagina). This screening involves a pelvic examination, including checking for microscopic changes to the surface of your cervix (Pap test). You may be encouraged to have this screening done every 3 years, beginning at age 21.  For women ages 30-65, health care providers may recommend pelvic exams and Pap testing every 3 years, or they may recommend the Pap and pelvic exam, combined with testing for human papilloma virus (HPV), every 5 years. Some types of HPV increase your risk of cervical cancer. Testing for HPV may also be done on women of any age with unclear Pap test results.  Other health care providers may not recommend any screening for nonpregnant women who are considered low risk for pelvic cancer and who do not have symptoms. Ask your health care provider if a screening pelvic exam is right for you.  If you have had past treatment for cervical cancer or a condition that could lead to cancer, you need Pap tests and screening for cancer for at least 20 years after your treatment. If Pap tests have been discontinued, your risk factors (such as having a new sexual partner) need to be reassessed to determine if screening should resume. Some women have medical problems that increase the chance of getting cervical cancer. In these cases, your health care provider may recommend more frequent screening and Pap tests. Colorectal Cancer  This type of cancer can be detected and often prevented.  Routine colorectal cancer screening usually begins at 79 years of age and continues through 79 years of age.  Your health care provider may recommend screening at an earlier age if you have risk factors for colon cancer.  Your health care provider may also recommend using home test kits to check for hidden blood in the stool.  A small camera at the end of a tube can be used to examine your colon directly (sigmoidoscopy or colonoscopy). This is done to check for the earliest forms of colorectal  cancer.  Routine screening usually begins at age 50.  Direct examination of the colon should be repeated every 5-10 years through 79 years of age. However, you may need to be screened more often if early forms of precancerous polyps or small growths are found. Skin Cancer  Check your skin from head to toe regularly.  Tell your health care provider about any new moles or changes in moles, especially if there is a change in a mole's shape or color.  Also tell your health care provider if you have a mole that is larger than the size of a pencil eraser.  Always use sunscreen. Apply sunscreen liberally and repeatedly throughout the day.  Protect yourself by wearing long sleeves, pants, a wide-brimmed hat, and sunglasses whenever you are outside. HEART DISEASE, DIABETES, AND HIGH BLOOD PRESSURE   High blood pressure causes heart disease and increases the risk of stroke. High blood pressure is more likely to develop in:  People who have blood pressure in the high end   of the normal range (130-139/85-89 mm Hg).  People who are overweight or obese.  People who are African American.  If you are 38-23 years of age, have your blood pressure checked every 3-5 years. If you are 61 years of age or older, have your blood pressure checked every year. You should have your blood pressure measured twice--once when you are at a hospital or clinic, and once when you are not at a hospital or clinic. Record the average of the two measurements. To check your blood pressure when you are not at a hospital or clinic, you can use:  An automated blood pressure machine at a pharmacy.  A home blood pressure monitor.  If you are between 45 years and 39 years old, ask your health care provider if you should take aspirin to prevent strokes.  Have regular diabetes screenings. This involves taking a blood sample to check your fasting blood sugar level.  If you are at a normal weight and have a low risk for diabetes,  have this test once every three years after 79 years of age.  If you are overweight and have a high risk for diabetes, consider being tested at a younger age or more often. PREVENTING INFECTION  Hepatitis B  If you have a higher risk for hepatitis B, you should be screened for this virus. You are considered at high risk for hepatitis B if:  You were born in a country where hepatitis B is common. Ask your health care provider which countries are considered high risk.  Your parents were born in a high-risk country, and you have not been immunized against hepatitis B (hepatitis B vaccine).  You have HIV or AIDS.  You use needles to inject street drugs.  You live with someone who has hepatitis B.  You have had sex with someone who has hepatitis B.  You get hemodialysis treatment.  You take certain medicines for conditions, including cancer, organ transplantation, and autoimmune conditions. Hepatitis C  Blood testing is recommended for:  Everyone born from [birth year] through 1963.  Anyone with known risk factors for hepatitis C. Sexually transmitted infections (STIs)  You should be screened for sexually transmitted infections (STIs) including gonorrhea and chlamydia if:  You are sexually active and are younger than 79 years of age.  You are older than 79 years of age and your health care provider tells you that you are at risk for this type of infection.  Your sexual activity has changed since you were last screened and you are at an increased risk for chlamydia or gonorrhea. Ask your health care provider if you are at risk.  If you do not have HIV, but are at risk, it may be recommended that you take a prescription medicine daily to prevent HIV infection. This is called pre-exposure prophylaxis (PrEP). You are considered at risk if:  You are sexually active and do not regularly use condoms or know the HIV status of your partner(s).  You take drugs by injection.  You are sexually  active with a partner who has HIV. Talk with your health care provider about whether you are at high risk of being infected with HIV. If you choose to begin PrEP, you should first be tested for HIV. You should then be tested every 3 months for as long as you are taking PrEP.  PREGNANCY   If you are premenopausal and you may become pregnant, ask your health care provider about preconception counseling.  If you may  become pregnant, take 400 to 800 micrograms (mcg) of folic acid every day.  If you want to prevent pregnancy, talk to your health care provider about birth control (contraception). OSTEOPOROSIS AND MENOPAUSE   Osteoporosis is a disease in which the bones lose minerals and strength with aging. This can result in serious bone fractures. Your risk for osteoporosis can be identified using a bone density scan.  If you are 61 years of age or older, or if you are at risk for osteoporosis and fractures, ask your health care provider if you should be screened.  Ask your health care provider whether you should take a calcium or vitamin D supplement to lower your risk for osteoporosis.  Menopause may have certain physical symptoms and risks.  Hormone replacement therapy may reduce some of these symptoms and risks. Talk to your health care provider about whether hormone replacement therapy is right for you.  HOME CARE INSTRUCTIONS   Schedule regular health, dental, and eye exams.  Stay current with your immunizations.   Do not use any tobacco products including cigarettes, chewing tobacco, or electronic cigarettes.  If you are pregnant, do not drink alcohol.  If you are breastfeeding, limit how much and how often you drink alcohol.  Limit alcohol intake to no more than 1 drink per day for nonpregnant women. One drink equals 12 ounces of beer, 5 ounces of wine, or 1 ounces of hard liquor.  Do not use street drugs.  Do not share needles.  Ask your health care provider for help if  you need support or information about quitting drugs.  Tell your health care provider if you often feel depressed.  Tell your health care provider if you have ever been abused or do not feel safe at home.   This information is not intended to replace advice given to you by your health care provider. Make sure you discuss any questions you have with your health care provider.   Document Released: 04/25/2011 Document Revised: 10/31/2014 Document Reviewed: 09/11/2013 Elsevier Interactive Patient Education [employer].

## 2015-09-23 LAB — CMP14+EGFR
ALBUMIN: 3.9 g/dL (ref 3.5–4.7)
ALK PHOS: 75 IU/L (ref 39–117)
ALT: 13 IU/L (ref 0–32)
AST: 20 IU/L (ref 0–40)
Albumin/Globulin Ratio: 1.3 (ref 1.1–2.5)
BILIRUBIN TOTAL: 0.2 mg/dL (ref 0.0–1.2)
BUN / CREAT RATIO: 24 (ref 11–26)
BUN: 22 mg/dL (ref 8–27)
CHLORIDE: 104 mmol/L (ref 97–106)
CO2: 23 mmol/L (ref 18–29)
CREATININE: 0.91 mg/dL (ref 0.57–1.00)
Calcium: 9.5 mg/dL (ref 8.7–10.3)
GFR calc Af Amer: 66 mL/min/{1.73_m2} (ref >59)
GFR calc non Af Amer: 57 mL/min/{1.73_m2} — ABNORMAL LOW (ref >59)
GLUCOSE: 86 mg/dL (ref 65–99)
Globulin, Total: 3.1 g/dL (ref 1.5–4.5)
Potassium: 4.5 mmol/L (ref 3.5–5.2)
Sodium: 146 mmol/L — ABNORMAL HIGH (ref 136–144)
Total Protein: 7 g/dL (ref 6.0–8.5)

## 2015-09-23 LAB — THYROID PANEL WITH TSH
FREE THYROXINE INDEX: 2.1 (ref 1.2–4.9)
T3 Uptake Ratio: 30 % (ref 24–39)
T4 TOTAL: 7.1 ug/dL (ref 4.5–12.0)
TSH: 1.75 u[IU]/mL (ref 0.450–4.500)

## 2015-09-23 LAB — VITAMIN D 25 HYDROXY (VIT D DEFICIENCY, FRACTURES): VIT D 25 HYDROXY: 35.6 ng/mL (ref 30.0–100.0)

## 2015-10-27 IMAGING — CR DG SHOULDER 2+V*L*
3 series · 3 of 3 positions shown · non-contrast
Comparison: None

CLINICAL DATA: Left shoulder pain.  No known injury.

EXAM:
LEFT SHOULDER - 2+ VIEW

[view not recorded (1 of 3)]
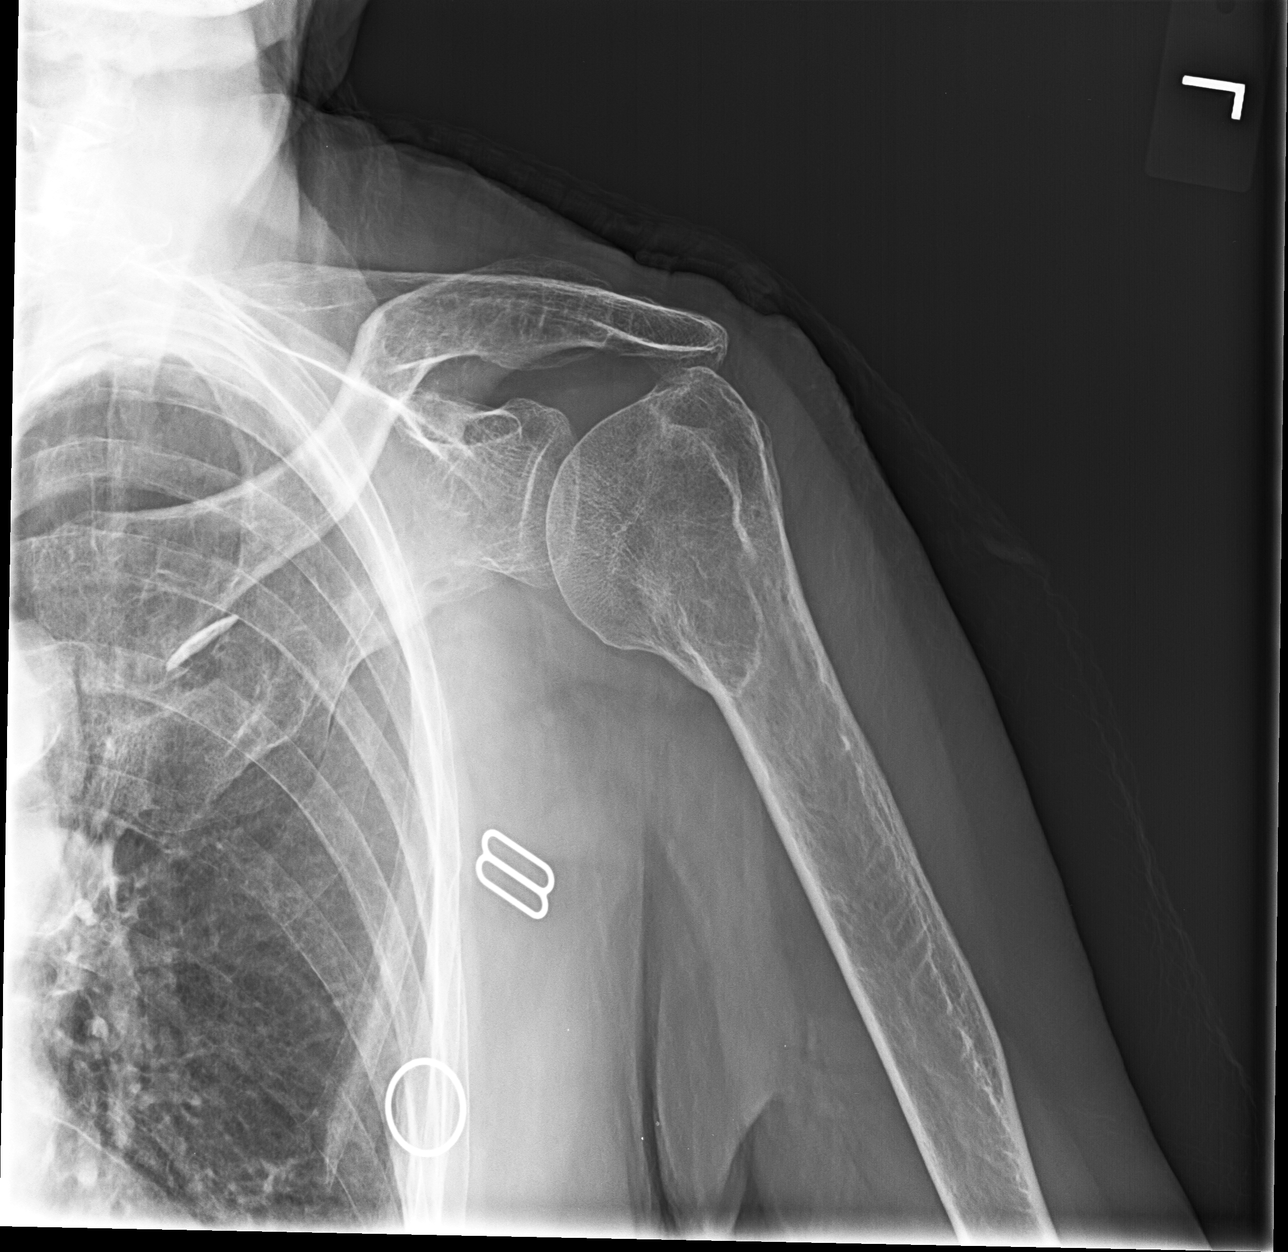

[view not recorded (2 of 3)]
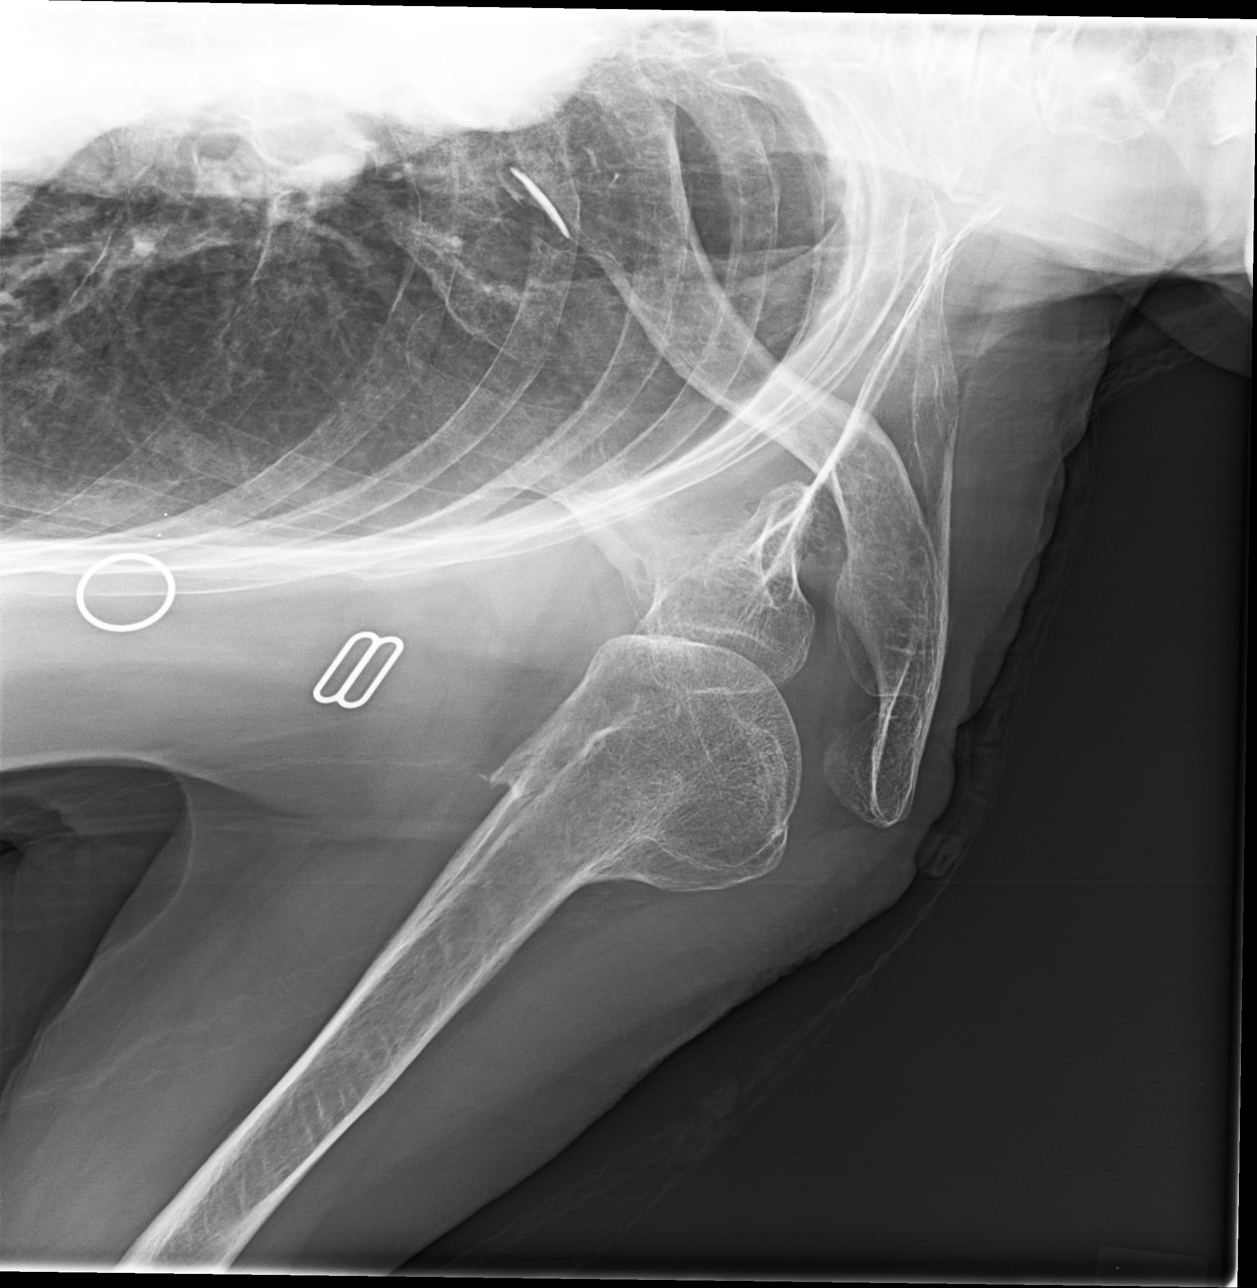

[view not recorded (3 of 3)]
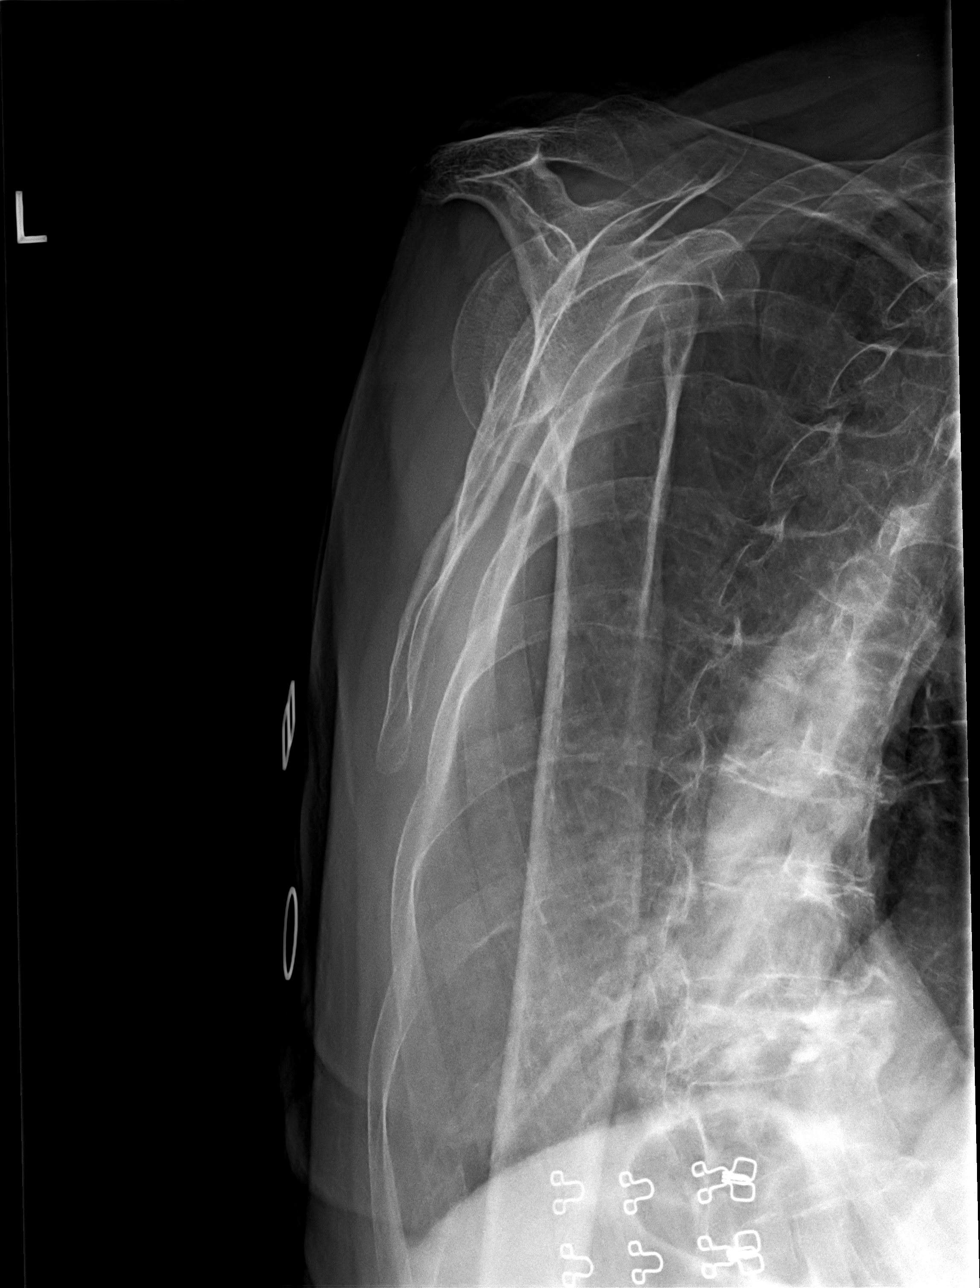

[3 of 3 positions shown; findings below may reference images not displayed]

FINDINGS: There is a healed fracture of the surgical neck of the left humerus
with persistent mild residual deformity. No definite acute fracture
or dislocation. Left glenohumeral and acromioclavicular joint spaces
are preserved given obliquity. No definite evidence of calcific
tendinitis. Regional soft tissues appear normal. Limited
visualization of left hemi thorax is normal.
IMPRESSION: 1. No acute findings.
2. Old/healed fracture involving the surgical neck of the left
humerus with persistent mild residual deformity.

## 2015-11-12 ENCOUNTER — Telehealth: Payer: Self-pay | Admitting: Family

## 2015-11-16 NOTE — Telephone Encounter (Signed)
No we do not want to start prior auth process at this time. I am going to discuss restarting [facility] with patient and then if she decides to restart will get PA. Left message at [facility]

## 2015-11-18 ENCOUNTER — Other Ambulatory Visit: Payer: Self-pay | Admitting: *Deleted

## 2015-11-18 MED ORDER — LOSARTAN POTASSIUM-HCTZ 50-12.5 MG PO TABS: 1.0000 | ORAL_TABLET | Freq: Every day | ORAL | Status: AC

## 2015-11-27 ENCOUNTER — Ambulatory Visit (INDEPENDENT_AMBULATORY_CARE_PROVIDER_SITE_OTHER): Payer: Medicare Other

## 2015-11-27 ENCOUNTER — Ambulatory Visit (INDEPENDENT_AMBULATORY_CARE_PROVIDER_SITE_OTHER): Payer: Medicare Other | Admitting: Family Medicine

## 2015-11-27 ENCOUNTER — Encounter: Payer: Self-pay | Admitting: Family Medicine

## 2015-11-27 VITALS — BP 149/79 | HR 82 | Temp 97.3°F | Ht 60.0 in | Wt 116.0 lb

## 2015-11-27 DIAGNOSIS — M545 Low back pain: Secondary | ICD-10-CM

## 2015-11-27 DIAGNOSIS — M8080XD Other osteoporosis with current pathological fracture, unspecified site, subsequent encounter for fracture with routine healing: Secondary | ICD-10-CM | POA: Diagnosis not present

## 2015-11-27 DIAGNOSIS — N3090 Cystitis, unspecified without hematuria: Secondary | ICD-10-CM

## 2015-11-27 LAB — POCT UA - MICROSCOPIC ONLY
BACTERIA, U MICROSCOPIC: NEGATIVE
CASTS, UR, LPF, POC: NEGATIVE
Crystals, Ur, HPF, POC: NEGATIVE
Epithelial cells, urine per micros: NEGATIVE
Mucus, UA: NEGATIVE

## 2015-11-27 LAB — POCT URINALYSIS DIPSTICK
Bilirubin, UA: NEGATIVE
GLUCOSE UA: NEGATIVE
Ketones, UA: NEGATIVE
NITRITE UA: NEGATIVE
Protein, UA: NEGATIVE
Spec Grav, UA: 1.015
UROBILINOGEN UA: NEGATIVE
pH, UA: 7

## 2015-11-27 MED ORDER — NITROFURANTOIN MACROCRYSTAL 100 MG PO CAPS: 100.0000 mg | ORAL_CAPSULE | Freq: Four times a day (QID) | ORAL | Status: AC

## 2015-11-27 MED ORDER — HYDROCODONE-ACETAMINOPHEN 5-325 MG PO TABS: 1.0000 | ORAL_TABLET | Freq: Three times a day (TID) | ORAL | Status: AC | PRN

## 2015-11-27 NOTE — Progress Notes (Signed)
Subjective:  Patient ID: Belinda Collins, female    DOB: 1927-10-06  Age: 82 y.o. MRN: [id number]  CC: Back Pain   HPI Belinda Collins presents for sudden onset of lower back pain this morning. She was feeling normal until she bent over this morning. She felt a sudden pop in her abdomen that radiated around to her lower back. Since then she has had lower back pain that she states is a 9/10. She says that a couple of years ago Dr. Channing Mutters put some cemented between 2 vertebrae in that area. She also states that she has osteoporosis that causes her to bend forward (dorsal kyphosis). She also has a tendency toward bladder infections that usually hurts in her lower back. And she states that she is having some burning with urination   History Belinda Collins has a past medical history of Hypothyroidism; Hypertension; Sacral fracture (HCC); Osteoporosis; Vitamin D deficiency; GERD (gastroesophageal reflux disease); Constipation; Cataract; Abdominal pain; Arthritis; and Osteoporosis.   She has past surgical history that includes Fixation kyphoplasty lumbar spine; Abdominal hysterectomy (Bilateral); Cataract extraction w/PHACO (Left, 07/24/2014); Cataract extraction w/PHACO (Right, 08/18/2014); and Eye surgery.   Her family history includes Cancer in her mother and sister; Heart disease in her brother and brother; Hypertension in her sister and sister; Parkinson's disease in her brother and sister; Peptic Ulcer in her son; Tuberculosis in her father.She reports that she has never smoked. She has never used smokeless tobacco. She reports that she does not drink alcohol or use illicit drugs.    ROS Review of Systems  Constitutional: Negative for fever, activity change and appetite change.  HENT: Negative for congestion, rhinorrhea and sore throat.   Eyes: Negative for visual disturbance.  Respiratory: Negative for cough and shortness of breath.   Cardiovascular: Negative for chest pain and palpitations.  Gastrointestinal:  Negative for nausea, abdominal pain and diarrhea.  Genitourinary: Negative for dysuria.  Musculoskeletal: Negative for myalgias and arthralgias.    Objective:  BP 149/79 mmHg  Pulse 82  Temp(Src) 97.3 F (36.3 C) (Oral)  Ht 5' (1.524 m)  Wt 116 lb (52.617 kg)  BMI 22.65 kg/m2  BP Readings from Last 3 Encounters:  11/27/15 149/79  09/22/15 136/68  07/13/15 112/63    Wt Readings from Last 3 Encounters:  11/27/15 116 lb (52.617 kg)  09/22/15 114 lb 9.6 oz (51.982 kg)  07/13/15 112 lb (50.803 kg)     Physical Exam  Constitutional: She is oriented to person, place, and time. She appears well-developed and well-nourished. No distress.  HENT:  Head: Normocephalic and atraumatic.  Right Ear: External ear normal.  Left Ear: External ear normal.  Nose: Nose normal.  Mouth/Throat: Oropharynx is clear and moist.  Eyes: Conjunctivae and EOM are normal. Pupils are equal, round, and reactive to light.  Neck: Normal range of motion. Neck supple. No thyromegaly present.  Cardiovascular: Normal rate, regular rhythm and normal heart sounds.   No murmur heard. Pulmonary/Chest: Effort normal and breath sounds normal. No respiratory distress. She has no wheezes. She has no rales.  Abdominal: Soft. Bowel sounds are normal. She exhibits no distension. There is tenderness (BLQ).  Musculoskeletal: She exhibits tenderness (for Lumbar spinal percussion).  Lymphadenopathy:    She has no cervical adenopathy.  Neurological: She is alert and oriented to person, place, and time. She has normal reflexes.  Skin: Skin is warm and dry.  Psychiatric: She has a normal mood and affect. Her behavior is normal. Judgment and thought content normal.  Lab Results  Component Value Date   WBC 8.1 01/29/2015   HGB 12.0* 01/29/2015   HCT 37.4* 01/29/2015   GLUCOSE 86 09/22/2015   CHOL 195 05/08/2013   TRIG 260* 05/08/2013   HDL 54 05/08/2013   LDLCALC 89 05/08/2013   ALT 13 09/22/2015   AST 20  09/22/2015   NA 146* 09/22/2015   K 4.5 09/22/2015   CL 104 09/22/2015   CREATININE 0.91 09/22/2015   BUN 22 09/22/2015   CO2 23 09/22/2015   TSH 1.750 09/22/2015    No results found.  Assessment & Plan:   Belinda Collins was seen today for back pain.  Diagnoses and all orders for this visit:  Low back pain without sciatica, unspecified back pain laterality -     POCT UA - Microscopic Only -     POCT urinalysis dipstick -     DG Lumbar Spine 2-3 Views; Future  Osteoporosis with fracture, with routine healing, subsequent encounter -     DG Lumbar Spine 2-3 Views; Future  Cystitis  Other orders -     HYDROcodone-acetaminophen (NORCO/VICODIN) 5-325 MG tablet; Take 1 tablet by mouth every 8 (eight) hours as needed for moderate pain or severe pain. -     nitrofurantoin (MACRODANTIN) 100 MG capsule; Take 1 capsule (100 mg total) by mouth 4 (four) times daily.    XR _ severe spondylosis and previous compression. Possible acute compresion  I have discontinued [name]'s HYDROcodone-acetaminophen. I have also changed her HYDROcodone-acetaminophen. Additionally, I am having her start on nitrofurantoin. Lastly, I am having her maintain her polyethylene glycol powder, Vitamin D, bisacodyl, ENSURE PLUS, magnesium hydroxide, traMADol, furosemide, levothyroxine, and losartan-hydrochlorothiazide.  Meds ordered this encounter  Medications  . DISCONTD: HYDROcodone-acetaminophen (NORCO/VICODIN) 5-325 MG tablet    Sig: Take 1 tablet by mouth every 6 (six) hours as needed for moderate pain.  Marland Kitchen HYDROcodone-acetaminophen (NORCO/VICODIN) 5-325 MG tablet    Sig: Take 1 tablet by mouth every 8 (eight) hours as needed for moderate pain or severe pain.    Dispense:  60 tablet    Refill:  0  . nitrofurantoin (MACRODANTIN) 100 MG capsule    Sig: Take 1 capsule (100 mg total) by mouth 4 (four) times daily.    Dispense:  14 capsule    Refill:  0     Follow-up: No Follow-up on file.  Mechele Claude,  M.D.

## 2016-03-29 DIAGNOSIS — K5909 Other constipation: Secondary | ICD-10-CM | POA: Diagnosis not present

## 2016-03-29 DIAGNOSIS — R1084 Generalized abdominal pain: Secondary | ICD-10-CM | POA: Diagnosis not present

## 2016-03-30 DIAGNOSIS — I10 Essential (primary) hypertension: Secondary | ICD-10-CM | POA: Diagnosis not present

## 2016-03-30 DIAGNOSIS — E038 Other specified hypothyroidism: Secondary | ICD-10-CM | POA: Diagnosis not present

## 2016-03-30 DIAGNOSIS — S32018D Other fracture of first lumbar vertebra, subsequent encounter for fracture with routine healing: Secondary | ICD-10-CM | POA: Diagnosis not present

## 2016-03-30 DIAGNOSIS — K219 Gastro-esophageal reflux disease without esophagitis: Secondary | ICD-10-CM | POA: Diagnosis not present

## 2016-04-07 DIAGNOSIS — R609 Edema, unspecified: Secondary | ICD-10-CM | POA: Diagnosis not present

## 2016-04-07 DIAGNOSIS — E039 Hypothyroidism, unspecified: Secondary | ICD-10-CM | POA: Diagnosis not present

## 2016-04-07 DIAGNOSIS — M436 Torticollis: Secondary | ICD-10-CM | POA: Diagnosis not present

## 2016-04-07 DIAGNOSIS — M8000XK Age-related osteoporosis with current pathological fracture, unspecified site, subsequent encounter for fracture with nonunion: Secondary | ICD-10-CM | POA: Diagnosis not present

## 2016-04-07 DIAGNOSIS — F329 Major depressive disorder, single episode, unspecified: Secondary | ICD-10-CM | POA: Diagnosis not present

## 2016-04-07 DIAGNOSIS — S32010K Wedge compression fracture of first lumbar vertebra, subsequent encounter for fracture with nonunion: Secondary | ICD-10-CM | POA: Diagnosis not present

## 2016-04-07 DIAGNOSIS — Z79899 Other long term (current) drug therapy: Secondary | ICD-10-CM | POA: Diagnosis not present

## 2016-04-07 DIAGNOSIS — G8929 Other chronic pain: Secondary | ICD-10-CM | POA: Diagnosis not present

## 2016-04-07 DIAGNOSIS — M5116 Intervertebral disc disorders with radiculopathy, lumbar region: Secondary | ICD-10-CM | POA: Diagnosis not present

## 2016-04-07 DIAGNOSIS — R269 Unspecified abnormalities of gait and mobility: Secondary | ICD-10-CM | POA: Diagnosis not present

## 2016-04-07 DIAGNOSIS — K5900 Constipation, unspecified: Secondary | ICD-10-CM | POA: Diagnosis not present

## 2016-04-07 DIAGNOSIS — K219 Gastro-esophageal reflux disease without esophagitis: Secondary | ICD-10-CM | POA: Diagnosis not present

## 2016-04-08 DIAGNOSIS — R2689 Other abnormalities of gait and mobility: Secondary | ICD-10-CM | POA: Diagnosis not present

## 2016-04-08 DIAGNOSIS — M6281 Muscle weakness (generalized): Secondary | ICD-10-CM | POA: Diagnosis not present

## 2016-04-08 DIAGNOSIS — K210 Gastro-esophageal reflux disease with esophagitis: Secondary | ICD-10-CM | POA: Diagnosis not present

## 2016-04-08 DIAGNOSIS — R1312 Dysphagia, oropharyngeal phase: Secondary | ICD-10-CM | POA: Diagnosis not present

## 2016-04-11 DIAGNOSIS — R1312 Dysphagia, oropharyngeal phase: Secondary | ICD-10-CM | POA: Diagnosis not present

## 2016-04-11 DIAGNOSIS — K210 Gastro-esophageal reflux disease with esophagitis: Secondary | ICD-10-CM | POA: Diagnosis not present

## 2016-04-11 DIAGNOSIS — R2689 Other abnormalities of gait and mobility: Secondary | ICD-10-CM | POA: Diagnosis not present

## 2016-04-11 DIAGNOSIS — M6281 Muscle weakness (generalized): Secondary | ICD-10-CM | POA: Diagnosis not present

## 2016-04-12 DIAGNOSIS — R2689 Other abnormalities of gait and mobility: Secondary | ICD-10-CM | POA: Diagnosis not present

## 2016-04-12 DIAGNOSIS — K210 Gastro-esophageal reflux disease with esophagitis: Secondary | ICD-10-CM | POA: Diagnosis not present

## 2016-04-12 DIAGNOSIS — M6281 Muscle weakness (generalized): Secondary | ICD-10-CM | POA: Diagnosis not present

## 2016-04-12 DIAGNOSIS — R1312 Dysphagia, oropharyngeal phase: Secondary | ICD-10-CM | POA: Diagnosis not present

## 2016-04-13 DIAGNOSIS — R2689 Other abnormalities of gait and mobility: Secondary | ICD-10-CM | POA: Diagnosis not present

## 2016-04-13 DIAGNOSIS — R1312 Dysphagia, oropharyngeal phase: Secondary | ICD-10-CM | POA: Diagnosis not present

## 2016-04-13 DIAGNOSIS — M6281 Muscle weakness (generalized): Secondary | ICD-10-CM | POA: Diagnosis not present

## 2016-04-13 DIAGNOSIS — K210 Gastro-esophageal reflux disease with esophagitis: Secondary | ICD-10-CM | POA: Diagnosis not present

## 2016-04-14 DIAGNOSIS — K210 Gastro-esophageal reflux disease with esophagitis: Secondary | ICD-10-CM | POA: Diagnosis not present

## 2016-04-14 DIAGNOSIS — R1312 Dysphagia, oropharyngeal phase: Secondary | ICD-10-CM | POA: Diagnosis not present

## 2016-04-14 DIAGNOSIS — M6281 Muscle weakness (generalized): Secondary | ICD-10-CM | POA: Diagnosis not present

## 2016-04-14 DIAGNOSIS — R2689 Other abnormalities of gait and mobility: Secondary | ICD-10-CM | POA: Diagnosis not present

## 2016-04-15 DIAGNOSIS — R2689 Other abnormalities of gait and mobility: Secondary | ICD-10-CM | POA: Diagnosis not present

## 2016-04-15 DIAGNOSIS — M6281 Muscle weakness (generalized): Secondary | ICD-10-CM | POA: Diagnosis not present

## 2016-04-15 DIAGNOSIS — K210 Gastro-esophageal reflux disease with esophagitis: Secondary | ICD-10-CM | POA: Diagnosis not present

## 2016-04-15 DIAGNOSIS — R1312 Dysphagia, oropharyngeal phase: Secondary | ICD-10-CM | POA: Diagnosis not present

## 2016-04-17 DIAGNOSIS — R1312 Dysphagia, oropharyngeal phase: Secondary | ICD-10-CM | POA: Diagnosis not present

## 2016-04-17 DIAGNOSIS — M6281 Muscle weakness (generalized): Secondary | ICD-10-CM | POA: Diagnosis not present

## 2016-04-17 DIAGNOSIS — K210 Gastro-esophageal reflux disease with esophagitis: Secondary | ICD-10-CM | POA: Diagnosis not present

## 2016-04-17 DIAGNOSIS — R2689 Other abnormalities of gait and mobility: Secondary | ICD-10-CM | POA: Diagnosis not present

## 2016-04-18 DIAGNOSIS — R2689 Other abnormalities of gait and mobility: Secondary | ICD-10-CM | POA: Diagnosis not present

## 2016-04-18 DIAGNOSIS — R1312 Dysphagia, oropharyngeal phase: Secondary | ICD-10-CM | POA: Diagnosis not present

## 2016-04-18 DIAGNOSIS — K210 Gastro-esophageal reflux disease with esophagitis: Secondary | ICD-10-CM | POA: Diagnosis not present

## 2016-04-18 DIAGNOSIS — M6281 Muscle weakness (generalized): Secondary | ICD-10-CM | POA: Diagnosis not present

## 2016-04-19 DIAGNOSIS — M6281 Muscle weakness (generalized): Secondary | ICD-10-CM | POA: Diagnosis not present

## 2016-04-19 DIAGNOSIS — K210 Gastro-esophageal reflux disease with esophagitis: Secondary | ICD-10-CM | POA: Diagnosis not present

## 2016-04-19 DIAGNOSIS — R2689 Other abnormalities of gait and mobility: Secondary | ICD-10-CM | POA: Diagnosis not present

## 2016-04-19 DIAGNOSIS — R1312 Dysphagia, oropharyngeal phase: Secondary | ICD-10-CM | POA: Diagnosis not present

## 2016-04-20 DIAGNOSIS — M6281 Muscle weakness (generalized): Secondary | ICD-10-CM | POA: Diagnosis not present

## 2016-04-20 DIAGNOSIS — K210 Gastro-esophageal reflux disease with esophagitis: Secondary | ICD-10-CM | POA: Diagnosis not present

## 2016-04-20 DIAGNOSIS — R2689 Other abnormalities of gait and mobility: Secondary | ICD-10-CM | POA: Diagnosis not present

## 2016-04-20 DIAGNOSIS — R1312 Dysphagia, oropharyngeal phase: Secondary | ICD-10-CM | POA: Diagnosis not present

## 2016-04-21 DIAGNOSIS — M6281 Muscle weakness (generalized): Secondary | ICD-10-CM | POA: Diagnosis not present

## 2016-04-21 DIAGNOSIS — K210 Gastro-esophageal reflux disease with esophagitis: Secondary | ICD-10-CM | POA: Diagnosis not present

## 2016-04-21 DIAGNOSIS — R2689 Other abnormalities of gait and mobility: Secondary | ICD-10-CM | POA: Diagnosis not present

## 2016-04-21 DIAGNOSIS — R1312 Dysphagia, oropharyngeal phase: Secondary | ICD-10-CM | POA: Diagnosis not present

## 2016-04-22 DIAGNOSIS — M6281 Muscle weakness (generalized): Secondary | ICD-10-CM | POA: Diagnosis not present

## 2016-04-22 DIAGNOSIS — R1312 Dysphagia, oropharyngeal phase: Secondary | ICD-10-CM | POA: Diagnosis not present

## 2016-04-22 DIAGNOSIS — R2689 Other abnormalities of gait and mobility: Secondary | ICD-10-CM | POA: Diagnosis not present

## 2016-04-22 DIAGNOSIS — K210 Gastro-esophageal reflux disease with esophagitis: Secondary | ICD-10-CM | POA: Diagnosis not present

## 2016-04-25 DIAGNOSIS — M6281 Muscle weakness (generalized): Secondary | ICD-10-CM | POA: Diagnosis not present

## 2016-04-25 DIAGNOSIS — R2689 Other abnormalities of gait and mobility: Secondary | ICD-10-CM | POA: Diagnosis not present

## 2016-04-25 DIAGNOSIS — R1312 Dysphagia, oropharyngeal phase: Secondary | ICD-10-CM | POA: Diagnosis not present

## 2016-04-25 DIAGNOSIS — K210 Gastro-esophageal reflux disease with esophagitis: Secondary | ICD-10-CM | POA: Diagnosis not present

## 2016-04-26 DIAGNOSIS — K210 Gastro-esophageal reflux disease with esophagitis: Secondary | ICD-10-CM | POA: Diagnosis not present

## 2016-04-26 DIAGNOSIS — M6281 Muscle weakness (generalized): Secondary | ICD-10-CM | POA: Diagnosis not present

## 2016-04-26 DIAGNOSIS — R1312 Dysphagia, oropharyngeal phase: Secondary | ICD-10-CM | POA: Diagnosis not present

## 2016-04-26 DIAGNOSIS — R2689 Other abnormalities of gait and mobility: Secondary | ICD-10-CM | POA: Diagnosis not present

## 2016-04-27 DIAGNOSIS — R1312 Dysphagia, oropharyngeal phase: Secondary | ICD-10-CM | POA: Diagnosis not present

## 2016-04-27 DIAGNOSIS — R2689 Other abnormalities of gait and mobility: Secondary | ICD-10-CM | POA: Diagnosis not present

## 2016-04-27 DIAGNOSIS — M6281 Muscle weakness (generalized): Secondary | ICD-10-CM | POA: Diagnosis not present

## 2016-04-27 DIAGNOSIS — K210 Gastro-esophageal reflux disease with esophagitis: Secondary | ICD-10-CM | POA: Diagnosis not present

## 2016-04-28 DIAGNOSIS — R2689 Other abnormalities of gait and mobility: Secondary | ICD-10-CM | POA: Diagnosis not present

## 2016-04-28 DIAGNOSIS — K210 Gastro-esophageal reflux disease with esophagitis: Secondary | ICD-10-CM | POA: Diagnosis not present

## 2016-04-28 DIAGNOSIS — M6281 Muscle weakness (generalized): Secondary | ICD-10-CM | POA: Diagnosis not present

## 2016-04-28 DIAGNOSIS — R1312 Dysphagia, oropharyngeal phase: Secondary | ICD-10-CM | POA: Diagnosis not present

## 2016-04-29 DIAGNOSIS — K210 Gastro-esophageal reflux disease with esophagitis: Secondary | ICD-10-CM | POA: Diagnosis not present

## 2016-04-29 DIAGNOSIS — M6281 Muscle weakness (generalized): Secondary | ICD-10-CM | POA: Diagnosis not present

## 2016-04-29 DIAGNOSIS — R1312 Dysphagia, oropharyngeal phase: Secondary | ICD-10-CM | POA: Diagnosis not present

## 2016-04-29 DIAGNOSIS — R2689 Other abnormalities of gait and mobility: Secondary | ICD-10-CM | POA: Diagnosis not present

## 2016-05-02 DIAGNOSIS — R1312 Dysphagia, oropharyngeal phase: Secondary | ICD-10-CM | POA: Diagnosis not present

## 2016-05-02 DIAGNOSIS — M6281 Muscle weakness (generalized): Secondary | ICD-10-CM | POA: Diagnosis not present

## 2016-05-02 DIAGNOSIS — R2689 Other abnormalities of gait and mobility: Secondary | ICD-10-CM | POA: Diagnosis not present

## 2016-05-02 DIAGNOSIS — K210 Gastro-esophageal reflux disease with esophagitis: Secondary | ICD-10-CM | POA: Diagnosis not present

## 2016-05-03 DIAGNOSIS — M6281 Muscle weakness (generalized): Secondary | ICD-10-CM | POA: Diagnosis not present

## 2016-05-03 DIAGNOSIS — R2689 Other abnormalities of gait and mobility: Secondary | ICD-10-CM | POA: Diagnosis not present

## 2016-05-03 DIAGNOSIS — R1312 Dysphagia, oropharyngeal phase: Secondary | ICD-10-CM | POA: Diagnosis not present

## 2016-05-03 DIAGNOSIS — K210 Gastro-esophageal reflux disease with esophagitis: Secondary | ICD-10-CM | POA: Diagnosis not present

## 2016-05-04 DIAGNOSIS — R2689 Other abnormalities of gait and mobility: Secondary | ICD-10-CM | POA: Diagnosis not present

## 2016-05-04 DIAGNOSIS — M6281 Muscle weakness (generalized): Secondary | ICD-10-CM | POA: Diagnosis not present

## 2016-05-04 DIAGNOSIS — R1312 Dysphagia, oropharyngeal phase: Secondary | ICD-10-CM | POA: Diagnosis not present

## 2016-05-04 DIAGNOSIS — K210 Gastro-esophageal reflux disease with esophagitis: Secondary | ICD-10-CM | POA: Diagnosis not present

## 2016-05-05 DIAGNOSIS — R1312 Dysphagia, oropharyngeal phase: Secondary | ICD-10-CM | POA: Diagnosis not present

## 2016-05-05 DIAGNOSIS — R2689 Other abnormalities of gait and mobility: Secondary | ICD-10-CM | POA: Diagnosis not present

## 2016-05-05 DIAGNOSIS — M6281 Muscle weakness (generalized): Secondary | ICD-10-CM | POA: Diagnosis not present

## 2016-05-05 DIAGNOSIS — K210 Gastro-esophageal reflux disease with esophagitis: Secondary | ICD-10-CM | POA: Diagnosis not present

## 2016-05-06 DIAGNOSIS — K210 Gastro-esophageal reflux disease with esophagitis: Secondary | ICD-10-CM | POA: Diagnosis not present

## 2016-05-06 DIAGNOSIS — R2689 Other abnormalities of gait and mobility: Secondary | ICD-10-CM | POA: Diagnosis not present

## 2016-05-06 DIAGNOSIS — R1312 Dysphagia, oropharyngeal phase: Secondary | ICD-10-CM | POA: Diagnosis not present

## 2016-05-06 DIAGNOSIS — M6281 Muscle weakness (generalized): Secondary | ICD-10-CM | POA: Diagnosis not present

## 2016-05-09 DIAGNOSIS — R1312 Dysphagia, oropharyngeal phase: Secondary | ICD-10-CM | POA: Diagnosis not present

## 2016-05-09 DIAGNOSIS — R2689 Other abnormalities of gait and mobility: Secondary | ICD-10-CM | POA: Diagnosis not present

## 2016-05-09 DIAGNOSIS — K210 Gastro-esophageal reflux disease with esophagitis: Secondary | ICD-10-CM | POA: Diagnosis not present

## 2016-05-09 DIAGNOSIS — M6281 Muscle weakness (generalized): Secondary | ICD-10-CM | POA: Diagnosis not present

## 2016-05-10 DIAGNOSIS — K210 Gastro-esophageal reflux disease with esophagitis: Secondary | ICD-10-CM | POA: Diagnosis not present

## 2016-05-10 DIAGNOSIS — M6281 Muscle weakness (generalized): Secondary | ICD-10-CM | POA: Diagnosis not present

## 2016-05-10 DIAGNOSIS — R2689 Other abnormalities of gait and mobility: Secondary | ICD-10-CM | POA: Diagnosis not present

## 2016-05-10 DIAGNOSIS — R1312 Dysphagia, oropharyngeal phase: Secondary | ICD-10-CM | POA: Diagnosis not present

## 2016-05-11 DIAGNOSIS — R2689 Other abnormalities of gait and mobility: Secondary | ICD-10-CM | POA: Diagnosis not present

## 2016-05-11 DIAGNOSIS — R1312 Dysphagia, oropharyngeal phase: Secondary | ICD-10-CM | POA: Diagnosis not present

## 2016-05-11 DIAGNOSIS — M6281 Muscle weakness (generalized): Secondary | ICD-10-CM | POA: Diagnosis not present

## 2016-05-11 DIAGNOSIS — K210 Gastro-esophageal reflux disease with esophagitis: Secondary | ICD-10-CM | POA: Diagnosis not present

## 2016-05-12 DIAGNOSIS — M6281 Muscle weakness (generalized): Secondary | ICD-10-CM | POA: Diagnosis not present

## 2016-05-12 DIAGNOSIS — K210 Gastro-esophageal reflux disease with esophagitis: Secondary | ICD-10-CM | POA: Diagnosis not present

## 2016-05-12 DIAGNOSIS — R1312 Dysphagia, oropharyngeal phase: Secondary | ICD-10-CM | POA: Diagnosis not present

## 2016-05-12 DIAGNOSIS — R2689 Other abnormalities of gait and mobility: Secondary | ICD-10-CM | POA: Diagnosis not present

## 2016-05-13 DIAGNOSIS — K219 Gastro-esophageal reflux disease without esophagitis: Secondary | ICD-10-CM | POA: Diagnosis not present

## 2016-05-13 DIAGNOSIS — S32018D Other fracture of first lumbar vertebra, subsequent encounter for fracture with routine healing: Secondary | ICD-10-CM | POA: Diagnosis not present

## 2016-05-13 DIAGNOSIS — I10 Essential (primary) hypertension: Secondary | ICD-10-CM | POA: Diagnosis not present

## 2016-05-13 DIAGNOSIS — R1312 Dysphagia, oropharyngeal phase: Secondary | ICD-10-CM | POA: Diagnosis not present

## 2016-05-13 DIAGNOSIS — E038 Other specified hypothyroidism: Secondary | ICD-10-CM | POA: Diagnosis not present

## 2016-05-13 DIAGNOSIS — K210 Gastro-esophageal reflux disease with esophagitis: Secondary | ICD-10-CM | POA: Diagnosis not present

## 2016-05-13 DIAGNOSIS — R2689 Other abnormalities of gait and mobility: Secondary | ICD-10-CM | POA: Diagnosis not present

## 2016-05-13 DIAGNOSIS — M6281 Muscle weakness (generalized): Secondary | ICD-10-CM | POA: Diagnosis not present

## 2016-05-16 DIAGNOSIS — R1312 Dysphagia, oropharyngeal phase: Secondary | ICD-10-CM | POA: Diagnosis not present

## 2016-05-16 DIAGNOSIS — R2689 Other abnormalities of gait and mobility: Secondary | ICD-10-CM | POA: Diagnosis not present

## 2016-05-16 DIAGNOSIS — K210 Gastro-esophageal reflux disease with esophagitis: Secondary | ICD-10-CM | POA: Diagnosis not present

## 2016-05-16 DIAGNOSIS — M6281 Muscle weakness (generalized): Secondary | ICD-10-CM | POA: Diagnosis not present

## 2016-05-17 DIAGNOSIS — R1312 Dysphagia, oropharyngeal phase: Secondary | ICD-10-CM | POA: Diagnosis not present

## 2016-05-17 DIAGNOSIS — R2689 Other abnormalities of gait and mobility: Secondary | ICD-10-CM | POA: Diagnosis not present

## 2016-05-17 DIAGNOSIS — K210 Gastro-esophageal reflux disease with esophagitis: Secondary | ICD-10-CM | POA: Diagnosis not present

## 2016-05-17 DIAGNOSIS — M6281 Muscle weakness (generalized): Secondary | ICD-10-CM | POA: Diagnosis not present

## 2016-05-19 DIAGNOSIS — E039 Hypothyroidism, unspecified: Secondary | ICD-10-CM | POA: Diagnosis not present

## 2016-05-19 DIAGNOSIS — J449 Chronic obstructive pulmonary disease, unspecified: Secondary | ICD-10-CM | POA: Diagnosis not present

## 2016-05-19 DIAGNOSIS — M8000XK Age-related osteoporosis with current pathological fracture, unspecified site, subsequent encounter for fracture with nonunion: Secondary | ICD-10-CM | POA: Diagnosis not present

## 2016-05-19 DIAGNOSIS — K5900 Constipation, unspecified: Secondary | ICD-10-CM | POA: Diagnosis not present

## 2016-05-19 DIAGNOSIS — R609 Edema, unspecified: Secondary | ICD-10-CM | POA: Diagnosis not present

## 2016-05-19 DIAGNOSIS — M436 Torticollis: Secondary | ICD-10-CM | POA: Diagnosis not present

## 2016-05-19 DIAGNOSIS — M6281 Muscle weakness (generalized): Secondary | ICD-10-CM | POA: Diagnosis not present

## 2016-05-19 DIAGNOSIS — F329 Major depressive disorder, single episode, unspecified: Secondary | ICD-10-CM | POA: Diagnosis not present

## 2016-05-19 DIAGNOSIS — I700 Atherosclerosis of aorta: Secondary | ICD-10-CM | POA: Diagnosis not present

## 2016-05-19 DIAGNOSIS — R1312 Dysphagia, oropharyngeal phase: Secondary | ICD-10-CM | POA: Diagnosis not present

## 2016-05-19 DIAGNOSIS — R2689 Other abnormalities of gait and mobility: Secondary | ICD-10-CM | POA: Diagnosis not present

## 2016-05-19 DIAGNOSIS — I10 Essential (primary) hypertension: Secondary | ICD-10-CM | POA: Diagnosis not present

## 2016-05-19 DIAGNOSIS — S32010K Wedge compression fracture of first lumbar vertebra, subsequent encounter for fracture with nonunion: Secondary | ICD-10-CM | POA: Diagnosis not present

## 2016-05-19 DIAGNOSIS — R0789 Other chest pain: Secondary | ICD-10-CM | POA: Diagnosis not present

## 2016-05-19 DIAGNOSIS — J188 Other pneumonia, unspecified organism: Secondary | ICD-10-CM | POA: Diagnosis not present

## 2016-05-19 DIAGNOSIS — M4855XD Collapsed vertebra, not elsewhere classified, thoracolumbar region, subsequent encounter for fracture with routine healing: Secondary | ICD-10-CM | POA: Diagnosis not present

## 2016-05-19 DIAGNOSIS — R269 Unspecified abnormalities of gait and mobility: Secondary | ICD-10-CM | POA: Diagnosis not present

## 2016-05-19 DIAGNOSIS — G8929 Other chronic pain: Secondary | ICD-10-CM | POA: Diagnosis not present

## 2016-05-19 DIAGNOSIS — K219 Gastro-esophageal reflux disease without esophagitis: Secondary | ICD-10-CM | POA: Diagnosis not present

## 2016-05-19 DIAGNOSIS — K210 Gastro-esophageal reflux disease with esophagitis: Secondary | ICD-10-CM | POA: Diagnosis not present

## 2016-05-19 DIAGNOSIS — M810 Age-related osteoporosis without current pathological fracture: Secondary | ICD-10-CM | POA: Diagnosis not present

## 2016-05-20 DIAGNOSIS — M6281 Muscle weakness (generalized): Secondary | ICD-10-CM | POA: Diagnosis not present

## 2016-05-20 DIAGNOSIS — K210 Gastro-esophageal reflux disease with esophagitis: Secondary | ICD-10-CM | POA: Diagnosis not present

## 2016-05-20 DIAGNOSIS — M7989 Other specified soft tissue disorders: Secondary | ICD-10-CM | POA: Diagnosis not present

## 2016-05-20 DIAGNOSIS — R2689 Other abnormalities of gait and mobility: Secondary | ICD-10-CM | POA: Diagnosis not present

## 2016-05-20 DIAGNOSIS — R1312 Dysphagia, oropharyngeal phase: Secondary | ICD-10-CM | POA: Diagnosis not present

## 2016-05-24 DIAGNOSIS — K210 Gastro-esophageal reflux disease with esophagitis: Secondary | ICD-10-CM | POA: Diagnosis not present

## 2016-05-24 DIAGNOSIS — R1312 Dysphagia, oropharyngeal phase: Secondary | ICD-10-CM | POA: Diagnosis not present

## 2016-05-25 DIAGNOSIS — K210 Gastro-esophageal reflux disease with esophagitis: Secondary | ICD-10-CM | POA: Diagnosis not present

## 2016-05-25 DIAGNOSIS — R1312 Dysphagia, oropharyngeal phase: Secondary | ICD-10-CM | POA: Diagnosis not present

## 2016-05-26 DIAGNOSIS — K210 Gastro-esophageal reflux disease with esophagitis: Secondary | ICD-10-CM | POA: Diagnosis not present

## 2016-05-26 DIAGNOSIS — R1312 Dysphagia, oropharyngeal phase: Secondary | ICD-10-CM | POA: Diagnosis not present

## 2016-05-27 DIAGNOSIS — K210 Gastro-esophageal reflux disease with esophagitis: Secondary | ICD-10-CM | POA: Diagnosis not present

## 2016-05-27 DIAGNOSIS — R1312 Dysphagia, oropharyngeal phase: Secondary | ICD-10-CM | POA: Diagnosis not present

## 2016-05-31 DIAGNOSIS — K210 Gastro-esophageal reflux disease with esophagitis: Secondary | ICD-10-CM | POA: Diagnosis not present

## 2016-05-31 DIAGNOSIS — R1312 Dysphagia, oropharyngeal phase: Secondary | ICD-10-CM | POA: Diagnosis not present

## 2016-06-01 DIAGNOSIS — R1312 Dysphagia, oropharyngeal phase: Secondary | ICD-10-CM | POA: Diagnosis not present

## 2016-06-01 DIAGNOSIS — K210 Gastro-esophageal reflux disease with esophagitis: Secondary | ICD-10-CM | POA: Diagnosis not present

## 2016-06-02 DIAGNOSIS — K210 Gastro-esophageal reflux disease with esophagitis: Secondary | ICD-10-CM | POA: Diagnosis not present

## 2016-06-02 DIAGNOSIS — R1312 Dysphagia, oropharyngeal phase: Secondary | ICD-10-CM | POA: Diagnosis not present

## 2016-06-06 DIAGNOSIS — Z048 Encounter for examination and observation for other specified reasons: Secondary | ICD-10-CM | POA: Diagnosis not present

## 2016-06-07 DIAGNOSIS — R1312 Dysphagia, oropharyngeal phase: Secondary | ICD-10-CM | POA: Diagnosis not present

## 2016-06-07 DIAGNOSIS — K210 Gastro-esophageal reflux disease with esophagitis: Secondary | ICD-10-CM | POA: Diagnosis not present

## 2016-06-09 DIAGNOSIS — K210 Gastro-esophageal reflux disease with esophagitis: Secondary | ICD-10-CM | POA: Diagnosis not present

## 2016-06-09 DIAGNOSIS — R1312 Dysphagia, oropharyngeal phase: Secondary | ICD-10-CM | POA: Diagnosis not present

## 2016-06-27 DIAGNOSIS — K219 Gastro-esophageal reflux disease without esophagitis: Secondary | ICD-10-CM | POA: Diagnosis not present

## 2016-06-27 DIAGNOSIS — E038 Other specified hypothyroidism: Secondary | ICD-10-CM | POA: Diagnosis not present

## 2016-06-27 DIAGNOSIS — S32018D Other fracture of first lumbar vertebra, subsequent encounter for fracture with routine healing: Secondary | ICD-10-CM | POA: Diagnosis not present

## 2016-06-27 DIAGNOSIS — I10 Essential (primary) hypertension: Secondary | ICD-10-CM | POA: Diagnosis not present

## 2016-07-05 DIAGNOSIS — M8000XK Age-related osteoporosis with current pathological fracture, unspecified site, subsequent encounter for fracture with nonunion: Secondary | ICD-10-CM | POA: Diagnosis not present

## 2016-07-05 DIAGNOSIS — R609 Edema, unspecified: Secondary | ICD-10-CM | POA: Diagnosis not present

## 2016-07-05 DIAGNOSIS — F329 Major depressive disorder, single episode, unspecified: Secondary | ICD-10-CM | POA: Diagnosis not present

## 2016-07-05 DIAGNOSIS — K219 Gastro-esophageal reflux disease without esophagitis: Secondary | ICD-10-CM | POA: Diagnosis not present

## 2016-07-05 DIAGNOSIS — M436 Torticollis: Secondary | ICD-10-CM | POA: Diagnosis not present

## 2016-07-05 DIAGNOSIS — M545 Low back pain: Secondary | ICD-10-CM | POA: Diagnosis not present

## 2016-07-05 DIAGNOSIS — Z79891 Long term (current) use of opiate analgesic: Secondary | ICD-10-CM | POA: Diagnosis not present

## 2016-07-05 DIAGNOSIS — S32010K Wedge compression fracture of first lumbar vertebra, subsequent encounter for fracture with nonunion: Secondary | ICD-10-CM | POA: Diagnosis not present

## 2016-07-05 DIAGNOSIS — G8929 Other chronic pain: Secondary | ICD-10-CM | POA: Diagnosis not present

## 2016-07-05 DIAGNOSIS — E039 Hypothyroidism, unspecified: Secondary | ICD-10-CM | POA: Diagnosis not present

## 2016-07-05 DIAGNOSIS — K5900 Constipation, unspecified: Secondary | ICD-10-CM | POA: Diagnosis not present

## 2016-07-05 DIAGNOSIS — R269 Unspecified abnormalities of gait and mobility: Secondary | ICD-10-CM | POA: Diagnosis not present

## 2016-08-03 DIAGNOSIS — Z23 Encounter for immunization: Secondary | ICD-10-CM | POA: Diagnosis not present

## 2016-08-10 DIAGNOSIS — E038 Other specified hypothyroidism: Secondary | ICD-10-CM | POA: Diagnosis not present

## 2016-08-10 DIAGNOSIS — S32018D Other fracture of first lumbar vertebra, subsequent encounter for fracture with routine healing: Secondary | ICD-10-CM | POA: Diagnosis not present

## 2016-08-10 DIAGNOSIS — I10 Essential (primary) hypertension: Secondary | ICD-10-CM | POA: Diagnosis not present

## 2016-08-10 DIAGNOSIS — K219 Gastro-esophageal reflux disease without esophagitis: Secondary | ICD-10-CM | POA: Diagnosis not present

## 2016-08-31 DIAGNOSIS — E039 Hypothyroidism, unspecified: Secondary | ICD-10-CM | POA: Diagnosis not present

## 2016-08-31 DIAGNOSIS — R269 Unspecified abnormalities of gait and mobility: Secondary | ICD-10-CM | POA: Diagnosis not present

## 2016-08-31 DIAGNOSIS — F329 Major depressive disorder, single episode, unspecified: Secondary | ICD-10-CM | POA: Diagnosis not present

## 2016-08-31 DIAGNOSIS — G8929 Other chronic pain: Secondary | ICD-10-CM | POA: Diagnosis not present

## 2016-08-31 DIAGNOSIS — Z79891 Long term (current) use of opiate analgesic: Secondary | ICD-10-CM | POA: Diagnosis not present

## 2016-08-31 DIAGNOSIS — S32010K Wedge compression fracture of first lumbar vertebra, subsequent encounter for fracture with nonunion: Secondary | ICD-10-CM | POA: Diagnosis not present

## 2016-08-31 DIAGNOSIS — K5900 Constipation, unspecified: Secondary | ICD-10-CM | POA: Diagnosis not present

## 2016-08-31 DIAGNOSIS — K219 Gastro-esophageal reflux disease without esophagitis: Secondary | ICD-10-CM | POA: Diagnosis not present

## 2016-08-31 DIAGNOSIS — M8000XK Age-related osteoporosis with current pathological fracture, unspecified site, subsequent encounter for fracture with nonunion: Secondary | ICD-10-CM | POA: Diagnosis not present

## 2016-08-31 DIAGNOSIS — M436 Torticollis: Secondary | ICD-10-CM | POA: Diagnosis not present

## 2016-08-31 DIAGNOSIS — M545 Low back pain: Secondary | ICD-10-CM | POA: Diagnosis not present

## 2016-08-31 DIAGNOSIS — R609 Edema, unspecified: Secondary | ICD-10-CM | POA: Diagnosis not present

## 2016-09-06 DIAGNOSIS — K219 Gastro-esophageal reflux disease without esophagitis: Secondary | ICD-10-CM | POA: Diagnosis not present

## 2016-09-06 DIAGNOSIS — E038 Other specified hypothyroidism: Secondary | ICD-10-CM | POA: Diagnosis not present

## 2016-09-06 DIAGNOSIS — Z880 Allergy status to penicillin: Secondary | ICD-10-CM | POA: Diagnosis not present

## 2016-09-06 DIAGNOSIS — R079 Chest pain, unspecified: Secondary | ICD-10-CM | POA: Diagnosis not present

## 2016-09-06 DIAGNOSIS — R748 Abnormal levels of other serum enzymes: Secondary | ICD-10-CM | POA: Diagnosis not present

## 2016-09-06 DIAGNOSIS — M5489 Other dorsalgia: Secondary | ICD-10-CM | POA: Diagnosis not present

## 2016-09-06 DIAGNOSIS — K5909 Other constipation: Secondary | ICD-10-CM | POA: Diagnosis not present

## 2016-09-06 DIAGNOSIS — F333 Major depressive disorder, recurrent, severe with psychotic symptoms: Secondary | ICD-10-CM | POA: Diagnosis not present

## 2016-09-06 DIAGNOSIS — E039 Hypothyroidism, unspecified: Secondary | ICD-10-CM | POA: Diagnosis not present

## 2016-09-06 DIAGNOSIS — I214 Non-ST elevation (NSTEMI) myocardial infarction: Secondary | ICD-10-CM | POA: Diagnosis not present

## 2016-09-06 DIAGNOSIS — R0789 Other chest pain: Secondary | ICD-10-CM | POA: Diagnosis not present

## 2016-09-06 DIAGNOSIS — I10 Essential (primary) hypertension: Secondary | ICD-10-CM | POA: Diagnosis not present

## 2016-09-06 DIAGNOSIS — M549 Dorsalgia, unspecified: Secondary | ICD-10-CM | POA: Diagnosis not present

## 2016-09-07 DIAGNOSIS — E038 Other specified hypothyroidism: Secondary | ICD-10-CM | POA: Diagnosis not present

## 2016-09-07 DIAGNOSIS — I10 Essential (primary) hypertension: Secondary | ICD-10-CM | POA: Diagnosis not present

## 2016-09-07 DIAGNOSIS — M5489 Other dorsalgia: Secondary | ICD-10-CM | POA: Diagnosis not present

## 2016-09-07 DIAGNOSIS — K219 Gastro-esophageal reflux disease without esophagitis: Secondary | ICD-10-CM | POA: Diagnosis not present

## 2016-09-07 DIAGNOSIS — Z7401 Bed confinement status: Secondary | ICD-10-CM | POA: Diagnosis not present

## 2016-09-07 DIAGNOSIS — I214 Non-ST elevation (NSTEMI) myocardial infarction: Secondary | ICD-10-CM | POA: Diagnosis not present

## 2016-09-07 DIAGNOSIS — R279 Unspecified lack of coordination: Secondary | ICD-10-CM | POA: Diagnosis not present

## 2016-09-25 DIAGNOSIS — I10 Essential (primary) hypertension: Secondary | ICD-10-CM | POA: Diagnosis not present

## 2016-09-25 DIAGNOSIS — E038 Other specified hypothyroidism: Secondary | ICD-10-CM | POA: Diagnosis not present

## 2016-09-25 DIAGNOSIS — K219 Gastro-esophageal reflux disease without esophagitis: Secondary | ICD-10-CM | POA: Diagnosis not present

## 2016-11-06 DIAGNOSIS — E038 Other specified hypothyroidism: Secondary | ICD-10-CM | POA: Diagnosis not present

## 2016-11-06 DIAGNOSIS — I10 Essential (primary) hypertension: Secondary | ICD-10-CM | POA: Diagnosis not present

## 2016-11-06 DIAGNOSIS — K219 Gastro-esophageal reflux disease without esophagitis: Secondary | ICD-10-CM | POA: Diagnosis not present

## 2016-11-29 DIAGNOSIS — R609 Edema, unspecified: Secondary | ICD-10-CM | POA: Diagnosis not present

## 2016-11-29 DIAGNOSIS — M436 Torticollis: Secondary | ICD-10-CM | POA: Diagnosis not present

## 2016-11-29 DIAGNOSIS — E039 Hypothyroidism, unspecified: Secondary | ICD-10-CM | POA: Diagnosis not present

## 2016-11-29 DIAGNOSIS — K219 Gastro-esophageal reflux disease without esophagitis: Secondary | ICD-10-CM | POA: Diagnosis not present

## 2016-11-29 DIAGNOSIS — S32010K Wedge compression fracture of first lumbar vertebra, subsequent encounter for fracture with nonunion: Secondary | ICD-10-CM | POA: Diagnosis not present

## 2016-11-29 DIAGNOSIS — F329 Major depressive disorder, single episode, unspecified: Secondary | ICD-10-CM | POA: Diagnosis not present

## 2016-11-29 DIAGNOSIS — M8000XK Age-related osteoporosis with current pathological fracture, unspecified site, subsequent encounter for fracture with nonunion: Secondary | ICD-10-CM | POA: Diagnosis not present

## 2016-11-29 DIAGNOSIS — G8929 Other chronic pain: Secondary | ICD-10-CM | POA: Diagnosis not present

## 2016-11-29 DIAGNOSIS — K5903 Drug induced constipation: Secondary | ICD-10-CM | POA: Diagnosis not present

## 2016-11-29 DIAGNOSIS — R269 Unspecified abnormalities of gait and mobility: Secondary | ICD-10-CM | POA: Diagnosis not present

## 2016-12-01 DIAGNOSIS — M6281 Muscle weakness (generalized): Secondary | ICD-10-CM | POA: Diagnosis not present

## 2016-12-01 DIAGNOSIS — R278 Other lack of coordination: Secondary | ICD-10-CM | POA: Diagnosis not present

## 2016-12-02 DIAGNOSIS — M6281 Muscle weakness (generalized): Secondary | ICD-10-CM | POA: Diagnosis not present

## 2016-12-02 DIAGNOSIS — R278 Other lack of coordination: Secondary | ICD-10-CM | POA: Diagnosis not present

## 2016-12-05 DIAGNOSIS — R278 Other lack of coordination: Secondary | ICD-10-CM | POA: Diagnosis not present

## 2016-12-05 DIAGNOSIS — M6281 Muscle weakness (generalized): Secondary | ICD-10-CM | POA: Diagnosis not present

## 2016-12-06 DIAGNOSIS — M6281 Muscle weakness (generalized): Secondary | ICD-10-CM | POA: Diagnosis not present

## 2016-12-06 DIAGNOSIS — R278 Other lack of coordination: Secondary | ICD-10-CM | POA: Diagnosis not present

## 2016-12-07 DIAGNOSIS — R278 Other lack of coordination: Secondary | ICD-10-CM | POA: Diagnosis not present

## 2016-12-07 DIAGNOSIS — M6281 Muscle weakness (generalized): Secondary | ICD-10-CM | POA: Diagnosis not present

## 2016-12-08 DIAGNOSIS — R278 Other lack of coordination: Secondary | ICD-10-CM | POA: Diagnosis not present

## 2016-12-08 DIAGNOSIS — M6281 Muscle weakness (generalized): Secondary | ICD-10-CM | POA: Diagnosis not present

## 2016-12-12 DIAGNOSIS — M6281 Muscle weakness (generalized): Secondary | ICD-10-CM | POA: Diagnosis not present

## 2016-12-12 DIAGNOSIS — R278 Other lack of coordination: Secondary | ICD-10-CM | POA: Diagnosis not present

## 2016-12-13 DIAGNOSIS — R278 Other lack of coordination: Secondary | ICD-10-CM | POA: Diagnosis not present

## 2016-12-13 DIAGNOSIS — M6281 Muscle weakness (generalized): Secondary | ICD-10-CM | POA: Diagnosis not present

## 2016-12-14 DIAGNOSIS — M6281 Muscle weakness (generalized): Secondary | ICD-10-CM | POA: Diagnosis not present

## 2016-12-14 DIAGNOSIS — R278 Other lack of coordination: Secondary | ICD-10-CM | POA: Diagnosis not present

## 2016-12-15 DIAGNOSIS — M6281 Muscle weakness (generalized): Secondary | ICD-10-CM | POA: Diagnosis not present

## 2016-12-15 DIAGNOSIS — R278 Other lack of coordination: Secondary | ICD-10-CM | POA: Diagnosis not present

## 2016-12-16 DIAGNOSIS — R278 Other lack of coordination: Secondary | ICD-10-CM | POA: Diagnosis not present

## 2016-12-16 DIAGNOSIS — M6281 Muscle weakness (generalized): Secondary | ICD-10-CM | POA: Diagnosis not present

## 2016-12-19 DIAGNOSIS — M6281 Muscle weakness (generalized): Secondary | ICD-10-CM | POA: Diagnosis not present

## 2016-12-19 DIAGNOSIS — R278 Other lack of coordination: Secondary | ICD-10-CM | POA: Diagnosis not present

## 2016-12-20 DIAGNOSIS — M6281 Muscle weakness (generalized): Secondary | ICD-10-CM | POA: Diagnosis not present

## 2016-12-20 DIAGNOSIS — R278 Other lack of coordination: Secondary | ICD-10-CM | POA: Diagnosis not present

## 2016-12-21 DIAGNOSIS — M6281 Muscle weakness (generalized): Secondary | ICD-10-CM | POA: Diagnosis not present

## 2016-12-21 DIAGNOSIS — R278 Other lack of coordination: Secondary | ICD-10-CM | POA: Diagnosis not present

## 2016-12-22 DIAGNOSIS — R278 Other lack of coordination: Secondary | ICD-10-CM | POA: Diagnosis not present

## 2016-12-22 DIAGNOSIS — M6281 Muscle weakness (generalized): Secondary | ICD-10-CM | POA: Diagnosis not present

## 2016-12-23 DIAGNOSIS — R278 Other lack of coordination: Secondary | ICD-10-CM | POA: Diagnosis not present

## 2016-12-23 DIAGNOSIS — M6281 Muscle weakness (generalized): Secondary | ICD-10-CM | POA: Diagnosis not present

## 2016-12-25 DIAGNOSIS — K219 Gastro-esophageal reflux disease without esophagitis: Secondary | ICD-10-CM | POA: Diagnosis not present

## 2016-12-25 DIAGNOSIS — I10 Essential (primary) hypertension: Secondary | ICD-10-CM | POA: Diagnosis not present

## 2016-12-25 DIAGNOSIS — E038 Other specified hypothyroidism: Secondary | ICD-10-CM | POA: Diagnosis not present

## 2016-12-26 DIAGNOSIS — R278 Other lack of coordination: Secondary | ICD-10-CM | POA: Diagnosis not present

## 2016-12-26 DIAGNOSIS — M6281 Muscle weakness (generalized): Secondary | ICD-10-CM | POA: Diagnosis not present

## 2016-12-27 DIAGNOSIS — M6281 Muscle weakness (generalized): Secondary | ICD-10-CM | POA: Diagnosis not present

## 2016-12-27 DIAGNOSIS — R278 Other lack of coordination: Secondary | ICD-10-CM | POA: Diagnosis not present

## 2017-01-23 DIAGNOSIS — Z79899 Other long term (current) drug therapy: Secondary | ICD-10-CM | POA: Diagnosis not present

## 2017-01-23 DIAGNOSIS — Z5181 Encounter for therapeutic drug level monitoring: Secondary | ICD-10-CM | POA: Diagnosis not present

## 2017-02-10 DIAGNOSIS — K219 Gastro-esophageal reflux disease without esophagitis: Secondary | ICD-10-CM | POA: Diagnosis not present

## 2017-02-10 DIAGNOSIS — I10 Essential (primary) hypertension: Secondary | ICD-10-CM | POA: Diagnosis not present

## 2017-02-10 DIAGNOSIS — E038 Other specified hypothyroidism: Secondary | ICD-10-CM | POA: Diagnosis not present

## 2017-02-27 DIAGNOSIS — M8000XK Age-related osteoporosis with current pathological fracture, unspecified site, subsequent encounter for fracture with nonunion: Secondary | ICD-10-CM | POA: Diagnosis not present

## 2017-02-27 DIAGNOSIS — R269 Unspecified abnormalities of gait and mobility: Secondary | ICD-10-CM | POA: Diagnosis not present

## 2017-02-27 DIAGNOSIS — M436 Torticollis: Secondary | ICD-10-CM | POA: Diagnosis not present

## 2017-02-27 DIAGNOSIS — E039 Hypothyroidism, unspecified: Secondary | ICD-10-CM | POA: Diagnosis not present

## 2017-02-27 DIAGNOSIS — K219 Gastro-esophageal reflux disease without esophagitis: Secondary | ICD-10-CM | POA: Diagnosis not present

## 2017-02-27 DIAGNOSIS — K5900 Constipation, unspecified: Secondary | ICD-10-CM | POA: Diagnosis not present

## 2017-02-27 DIAGNOSIS — S32010K Wedge compression fracture of first lumbar vertebra, subsequent encounter for fracture with nonunion: Secondary | ICD-10-CM | POA: Diagnosis not present

## 2017-02-27 DIAGNOSIS — R609 Edema, unspecified: Secondary | ICD-10-CM | POA: Diagnosis not present

## 2017-02-27 DIAGNOSIS — M545 Low back pain: Secondary | ICD-10-CM | POA: Diagnosis not present

## 2017-02-27 DIAGNOSIS — F329 Major depressive disorder, single episode, unspecified: Secondary | ICD-10-CM | POA: Diagnosis not present

## 2017-02-27 DIAGNOSIS — G8929 Other chronic pain: Secondary | ICD-10-CM | POA: Diagnosis not present

## 2017-03-22 DIAGNOSIS — E038 Other specified hypothyroidism: Secondary | ICD-10-CM | POA: Diagnosis not present

## 2017-03-22 DIAGNOSIS — K219 Gastro-esophageal reflux disease without esophagitis: Secondary | ICD-10-CM | POA: Diagnosis not present

## 2017-03-22 DIAGNOSIS — I10 Essential (primary) hypertension: Secondary | ICD-10-CM | POA: Diagnosis not present

## 2017-04-26 DIAGNOSIS — I10 Essential (primary) hypertension: Secondary | ICD-10-CM | POA: Diagnosis not present

## 2017-04-26 DIAGNOSIS — K219 Gastro-esophageal reflux disease without esophagitis: Secondary | ICD-10-CM | POA: Diagnosis not present

## 2017-04-26 DIAGNOSIS — E038 Other specified hypothyroidism: Secondary | ICD-10-CM | POA: Diagnosis not present

## 2017-05-11 DIAGNOSIS — L603 Nail dystrophy: Secondary | ICD-10-CM | POA: Diagnosis not present

## 2017-05-11 DIAGNOSIS — I739 Peripheral vascular disease, unspecified: Secondary | ICD-10-CM | POA: Diagnosis not present

## 2017-05-11 DIAGNOSIS — B351 Tinea unguium: Secondary | ICD-10-CM | POA: Diagnosis not present

## 2017-05-29 DIAGNOSIS — S32010K Wedge compression fracture of first lumbar vertebra, subsequent encounter for fracture with nonunion: Secondary | ICD-10-CM | POA: Diagnosis not present

## 2017-05-29 DIAGNOSIS — G8929 Other chronic pain: Secondary | ICD-10-CM | POA: Diagnosis not present

## 2017-05-29 DIAGNOSIS — R609 Edema, unspecified: Secondary | ICD-10-CM | POA: Diagnosis not present

## 2017-05-29 DIAGNOSIS — M436 Torticollis: Secondary | ICD-10-CM | POA: Diagnosis not present

## 2017-05-29 DIAGNOSIS — R269 Unspecified abnormalities of gait and mobility: Secondary | ICD-10-CM | POA: Diagnosis not present

## 2017-05-29 DIAGNOSIS — E039 Hypothyroidism, unspecified: Secondary | ICD-10-CM | POA: Diagnosis not present

## 2017-05-29 DIAGNOSIS — K5900 Constipation, unspecified: Secondary | ICD-10-CM | POA: Diagnosis not present

## 2017-05-29 DIAGNOSIS — M8000XK Age-related osteoporosis with current pathological fracture, unspecified site, subsequent encounter for fracture with nonunion: Secondary | ICD-10-CM | POA: Diagnosis not present

## 2017-05-29 DIAGNOSIS — K219 Gastro-esophageal reflux disease without esophagitis: Secondary | ICD-10-CM | POA: Diagnosis not present

## 2017-05-29 DIAGNOSIS — M545 Low back pain: Secondary | ICD-10-CM | POA: Diagnosis not present

## 2017-05-29 DIAGNOSIS — F329 Major depressive disorder, single episode, unspecified: Secondary | ICD-10-CM | POA: Diagnosis not present

## 2017-06-07 DIAGNOSIS — R1312 Dysphagia, oropharyngeal phase: Secondary | ICD-10-CM | POA: Diagnosis not present

## 2017-06-10 DIAGNOSIS — K219 Gastro-esophageal reflux disease without esophagitis: Secondary | ICD-10-CM | POA: Diagnosis not present

## 2017-06-10 DIAGNOSIS — I10 Essential (primary) hypertension: Secondary | ICD-10-CM | POA: Diagnosis not present

## 2017-06-10 DIAGNOSIS — E038 Other specified hypothyroidism: Secondary | ICD-10-CM | POA: Diagnosis not present

## 2017-07-27 DIAGNOSIS — E038 Other specified hypothyroidism: Secondary | ICD-10-CM | POA: Diagnosis not present

## 2017-07-27 DIAGNOSIS — K219 Gastro-esophageal reflux disease without esophagitis: Secondary | ICD-10-CM | POA: Diagnosis not present

## 2017-07-27 DIAGNOSIS — I10 Essential (primary) hypertension: Secondary | ICD-10-CM | POA: Diagnosis not present

## 2017-08-17 DIAGNOSIS — M545 Low back pain: Secondary | ICD-10-CM | POA: Diagnosis not present

## 2017-08-17 DIAGNOSIS — M436 Torticollis: Secondary | ICD-10-CM | POA: Diagnosis not present

## 2017-08-17 DIAGNOSIS — G8929 Other chronic pain: Secondary | ICD-10-CM | POA: Diagnosis not present

## 2017-08-17 DIAGNOSIS — M79604 Pain in right leg: Secondary | ICD-10-CM | POA: Diagnosis not present

## 2017-09-06 DIAGNOSIS — L603 Nail dystrophy: Secondary | ICD-10-CM | POA: Diagnosis not present

## 2017-09-06 DIAGNOSIS — B351 Tinea unguium: Secondary | ICD-10-CM | POA: Diagnosis not present

## 2017-09-06 DIAGNOSIS — I739 Peripheral vascular disease, unspecified: Secondary | ICD-10-CM | POA: Diagnosis not present

## 2017-09-14 DIAGNOSIS — I10 Essential (primary) hypertension: Secondary | ICD-10-CM | POA: Diagnosis not present

## 2017-09-14 DIAGNOSIS — E038 Other specified hypothyroidism: Secondary | ICD-10-CM | POA: Diagnosis not present

## 2017-09-14 DIAGNOSIS — K219 Gastro-esophageal reflux disease without esophagitis: Secondary | ICD-10-CM | POA: Diagnosis not present

## 2017-09-26 DIAGNOSIS — H26493 Other secondary cataract, bilateral: Secondary | ICD-10-CM | POA: Diagnosis not present

## 2017-09-26 DIAGNOSIS — Z961 Presence of intraocular lens: Secondary | ICD-10-CM | POA: Diagnosis not present

## 2017-11-04 DIAGNOSIS — E038 Other specified hypothyroidism: Secondary | ICD-10-CM | POA: Diagnosis not present

## 2017-11-04 DIAGNOSIS — K219 Gastro-esophageal reflux disease without esophagitis: Secondary | ICD-10-CM | POA: Diagnosis not present

## 2017-11-04 DIAGNOSIS — I10 Essential (primary) hypertension: Secondary | ICD-10-CM | POA: Diagnosis not present

## 2017-11-15 DIAGNOSIS — I739 Peripheral vascular disease, unspecified: Secondary | ICD-10-CM | POA: Diagnosis not present

## 2017-11-15 DIAGNOSIS — B351 Tinea unguium: Secondary | ICD-10-CM | POA: Diagnosis not present

## 2017-11-22 DIAGNOSIS — M8088XS Other osteoporosis with current pathological fracture, vertebra(e), sequela: Secondary | ICD-10-CM | POA: Diagnosis not present

## 2017-11-22 DIAGNOSIS — Z79891 Long term (current) use of opiate analgesic: Secondary | ICD-10-CM | POA: Diagnosis not present

## 2017-11-22 DIAGNOSIS — G894 Chronic pain syndrome: Secondary | ICD-10-CM | POA: Diagnosis not present

## 2017-11-22 DIAGNOSIS — M130 Polyarthritis, unspecified: Secondary | ICD-10-CM | POA: Diagnosis not present

## 2017-12-26 DIAGNOSIS — I10 Essential (primary) hypertension: Secondary | ICD-10-CM | POA: Diagnosis not present

## 2017-12-26 DIAGNOSIS — E038 Other specified hypothyroidism: Secondary | ICD-10-CM | POA: Diagnosis not present

## 2017-12-26 DIAGNOSIS — K219 Gastro-esophageal reflux disease without esophagitis: Secondary | ICD-10-CM | POA: Diagnosis not present

## 2018-01-22 DIAGNOSIS — Z5181 Encounter for therapeutic drug level monitoring: Secondary | ICD-10-CM | POA: Diagnosis not present

## 2018-01-22 DIAGNOSIS — Z79899 Other long term (current) drug therapy: Secondary | ICD-10-CM | POA: Diagnosis not present

## 2018-02-19 DIAGNOSIS — M8088XS Other osteoporosis with current pathological fracture, vertebra(e), sequela: Secondary | ICD-10-CM | POA: Diagnosis not present

## 2018-02-19 DIAGNOSIS — Z79891 Long term (current) use of opiate analgesic: Secondary | ICD-10-CM | POA: Diagnosis not present

## 2018-02-19 DIAGNOSIS — M130 Polyarthritis, unspecified: Secondary | ICD-10-CM | POA: Diagnosis not present

## 2018-02-19 DIAGNOSIS — G894 Chronic pain syndrome: Secondary | ICD-10-CM | POA: Diagnosis not present

## 2018-02-21 DIAGNOSIS — E038 Other specified hypothyroidism: Secondary | ICD-10-CM | POA: Diagnosis not present

## 2018-02-21 DIAGNOSIS — I10 Essential (primary) hypertension: Secondary | ICD-10-CM | POA: Diagnosis not present

## 2018-02-21 DIAGNOSIS — K219 Gastro-esophageal reflux disease without esophagitis: Secondary | ICD-10-CM | POA: Diagnosis not present

## 2018-02-27 DIAGNOSIS — I739 Peripheral vascular disease, unspecified: Secondary | ICD-10-CM | POA: Diagnosis not present

## 2018-02-27 DIAGNOSIS — B351 Tinea unguium: Secondary | ICD-10-CM | POA: Diagnosis not present

## 2018-04-06 DIAGNOSIS — I10 Essential (primary) hypertension: Secondary | ICD-10-CM | POA: Diagnosis not present

## 2018-04-06 DIAGNOSIS — K219 Gastro-esophageal reflux disease without esophagitis: Secondary | ICD-10-CM | POA: Diagnosis not present

## 2018-04-06 DIAGNOSIS — E038 Other specified hypothyroidism: Secondary | ICD-10-CM | POA: Diagnosis not present

## 2018-05-12 DIAGNOSIS — I10 Essential (primary) hypertension: Secondary | ICD-10-CM | POA: Diagnosis not present

## 2018-05-12 DIAGNOSIS — E038 Other specified hypothyroidism: Secondary | ICD-10-CM | POA: Diagnosis not present

## 2018-05-12 DIAGNOSIS — K219 Gastro-esophageal reflux disease without esophagitis: Secondary | ICD-10-CM | POA: Diagnosis not present

## 2018-06-28 DIAGNOSIS — L603 Nail dystrophy: Secondary | ICD-10-CM | POA: Diagnosis not present

## 2018-06-28 DIAGNOSIS — I739 Peripheral vascular disease, unspecified: Secondary | ICD-10-CM | POA: Diagnosis not present

## 2018-06-29 DIAGNOSIS — I10 Essential (primary) hypertension: Secondary | ICD-10-CM | POA: Diagnosis not present

## 2018-06-29 DIAGNOSIS — K219 Gastro-esophageal reflux disease without esophagitis: Secondary | ICD-10-CM | POA: Diagnosis not present

## 2018-06-29 DIAGNOSIS — E038 Other specified hypothyroidism: Secondary | ICD-10-CM | POA: Diagnosis not present

## 2018-07-05 DIAGNOSIS — I959 Hypotension, unspecified: Secondary | ICD-10-CM | POA: Diagnosis not present

## 2018-07-05 DIAGNOSIS — S82831A Other fracture of upper and lower end of right fibula, initial encounter for closed fracture: Secondary | ICD-10-CM | POA: Diagnosis not present

## 2018-07-05 DIAGNOSIS — M810 Age-related osteoporosis without current pathological fracture: Secondary | ICD-10-CM | POA: Diagnosis not present

## 2018-07-05 DIAGNOSIS — Z79899 Other long term (current) drug therapy: Secondary | ICD-10-CM | POA: Diagnosis not present

## 2018-07-05 DIAGNOSIS — I10 Essential (primary) hypertension: Secondary | ICD-10-CM | POA: Diagnosis not present

## 2018-07-05 DIAGNOSIS — S82231A Displaced oblique fracture of shaft of right tibia, initial encounter for closed fracture: Secondary | ICD-10-CM | POA: Diagnosis not present

## 2018-07-05 DIAGNOSIS — S82301A Unspecified fracture of lower end of right tibia, initial encounter for closed fracture: Secondary | ICD-10-CM | POA: Diagnosis not present

## 2018-07-05 DIAGNOSIS — S82391A Other fracture of lower end of right tibia, initial encounter for closed fracture: Secondary | ICD-10-CM | POA: Diagnosis not present

## 2018-07-05 DIAGNOSIS — Z7401 Bed confinement status: Secondary | ICD-10-CM | POA: Diagnosis not present

## 2018-07-05 DIAGNOSIS — R402421 Glasgow coma scale score 9-12, in the field [EMT or ambulance]: Secondary | ICD-10-CM | POA: Diagnosis not present

## 2018-07-05 DIAGNOSIS — S79912A Unspecified injury of left hip, initial encounter: Secondary | ICD-10-CM | POA: Diagnosis not present

## 2018-07-05 DIAGNOSIS — K219 Gastro-esophageal reflux disease without esophagitis: Secondary | ICD-10-CM | POA: Diagnosis not present

## 2018-07-05 DIAGNOSIS — E039 Hypothyroidism, unspecified: Secondary | ICD-10-CM | POA: Diagnosis not present

## 2018-07-05 DIAGNOSIS — Z87891 Personal history of nicotine dependence: Secondary | ICD-10-CM | POA: Diagnosis not present

## 2018-07-05 DIAGNOSIS — X58XXXA Exposure to other specified factors, initial encounter: Secondary | ICD-10-CM | POA: Diagnosis not present

## 2018-07-18 DIAGNOSIS — S82831A Other fracture of upper and lower end of right fibula, initial encounter for closed fracture: Secondary | ICD-10-CM | POA: Diagnosis not present

## 2018-07-18 DIAGNOSIS — R0902 Hypoxemia: Secondary | ICD-10-CM | POA: Diagnosis not present

## 2018-07-18 DIAGNOSIS — W19XXXA Unspecified fall, initial encounter: Secondary | ICD-10-CM | POA: Diagnosis not present

## 2018-07-18 DIAGNOSIS — R5381 Other malaise: Secondary | ICD-10-CM | POA: Diagnosis not present

## 2018-07-18 DIAGNOSIS — S82391A Other fracture of lower end of right tibia, initial encounter for closed fracture: Secondary | ICD-10-CM | POA: Diagnosis not present

## 2018-07-18 DIAGNOSIS — Y92129 Unspecified place in nursing home as the place of occurrence of the external cause: Secondary | ICD-10-CM | POA: Diagnosis not present

## 2018-07-18 DIAGNOSIS — S82871A Displaced pilon fracture of right tibia, initial encounter for closed fracture: Secondary | ICD-10-CM | POA: Diagnosis not present

## 2018-07-18 DIAGNOSIS — Z743 Need for continuous supervision: Secondary | ICD-10-CM | POA: Diagnosis not present

## 2018-07-18 DIAGNOSIS — R531 Weakness: Secondary | ICD-10-CM | POA: Diagnosis not present

## 2018-07-18 DIAGNOSIS — Z7401 Bed confinement status: Secondary | ICD-10-CM | POA: Diagnosis not present

## 2018-07-18 DIAGNOSIS — S82891A Other fracture of right lower leg, initial encounter for closed fracture: Secondary | ICD-10-CM | POA: Diagnosis not present

## 2018-07-24 DIAGNOSIS — Z23 Encounter for immunization: Secondary | ICD-10-CM | POA: Diagnosis not present

## 2018-07-26 DIAGNOSIS — R5383 Other fatigue: Secondary | ICD-10-CM | POA: Diagnosis not present

## 2018-07-26 DIAGNOSIS — R630 Anorexia: Secondary | ICD-10-CM | POA: Diagnosis not present

## 2018-08-02 DIAGNOSIS — R1312 Dysphagia, oropharyngeal phase: Secondary | ICD-10-CM | POA: Diagnosis not present

## 2018-08-02 DIAGNOSIS — F0390 Unspecified dementia without behavioral disturbance: Secondary | ICD-10-CM | POA: Diagnosis not present

## 2018-08-03 DIAGNOSIS — R1312 Dysphagia, oropharyngeal phase: Secondary | ICD-10-CM | POA: Diagnosis not present

## 2018-08-03 DIAGNOSIS — F0390 Unspecified dementia without behavioral disturbance: Secondary | ICD-10-CM | POA: Diagnosis not present

## 2018-08-06 DIAGNOSIS — F0390 Unspecified dementia without behavioral disturbance: Secondary | ICD-10-CM | POA: Diagnosis not present

## 2018-08-06 DIAGNOSIS — R1312 Dysphagia, oropharyngeal phase: Secondary | ICD-10-CM | POA: Diagnosis not present

## 2018-08-07 DIAGNOSIS — R52 Pain, unspecified: Secondary | ICD-10-CM | POA: Diagnosis not present

## 2018-08-07 DIAGNOSIS — R404 Transient alteration of awareness: Secondary | ICD-10-CM | POA: Diagnosis not present

## 2018-08-07 DIAGNOSIS — E43 Unspecified severe protein-calorie malnutrition: Secondary | ICD-10-CM | POA: Diagnosis not present

## 2018-08-07 DIAGNOSIS — F419 Anxiety disorder, unspecified: Secondary | ICD-10-CM | POA: Diagnosis not present

## 2018-08-07 DIAGNOSIS — Z66 Do not resuscitate: Secondary | ICD-10-CM | POA: Diagnosis not present

## 2018-08-07 DIAGNOSIS — R5381 Other malaise: Secondary | ICD-10-CM | POA: Diagnosis not present

## 2018-08-07 DIAGNOSIS — I209 Angina pectoris, unspecified: Secondary | ICD-10-CM | POA: Diagnosis not present

## 2018-08-07 DIAGNOSIS — E039 Hypothyroidism, unspecified: Secondary | ICD-10-CM | POA: Diagnosis not present

## 2018-08-07 DIAGNOSIS — F0390 Unspecified dementia without behavioral disturbance: Secondary | ICD-10-CM | POA: Diagnosis not present

## 2018-08-07 DIAGNOSIS — R682 Dry mouth, unspecified: Secondary | ICD-10-CM | POA: Diagnosis not present

## 2018-08-07 DIAGNOSIS — Z7401 Bed confinement status: Secondary | ICD-10-CM | POA: Diagnosis not present

## 2018-08-07 DIAGNOSIS — K219 Gastro-esophageal reflux disease without esophagitis: Secondary | ICD-10-CM | POA: Diagnosis not present

## 2018-08-07 DIAGNOSIS — S82832A Other fracture of upper and lower end of left fibula, initial encounter for closed fracture: Secondary | ICD-10-CM | POA: Diagnosis not present

## 2018-08-07 DIAGNOSIS — M810 Age-related osteoporosis without current pathological fracture: Secondary | ICD-10-CM | POA: Diagnosis not present

## 2018-08-07 DIAGNOSIS — R1312 Dysphagia, oropharyngeal phase: Secondary | ICD-10-CM | POA: Diagnosis not present

## 2018-08-07 DIAGNOSIS — M8580 Other specified disorders of bone density and structure, unspecified site: Secondary | ICD-10-CM | POA: Diagnosis not present

## 2018-08-07 DIAGNOSIS — I10 Essential (primary) hypertension: Secondary | ICD-10-CM | POA: Diagnosis not present

## 2018-08-08 DIAGNOSIS — K219 Gastro-esophageal reflux disease without esophagitis: Secondary | ICD-10-CM | POA: Diagnosis not present

## 2018-08-08 DIAGNOSIS — I10 Essential (primary) hypertension: Secondary | ICD-10-CM | POA: Diagnosis not present

## 2018-08-08 DIAGNOSIS — M8580 Other specified disorders of bone density and structure, unspecified site: Secondary | ICD-10-CM | POA: Diagnosis not present

## 2018-08-08 DIAGNOSIS — E43 Unspecified severe protein-calorie malnutrition: Secondary | ICD-10-CM | POA: Diagnosis not present

## 2018-08-08 DIAGNOSIS — E039 Hypothyroidism, unspecified: Secondary | ICD-10-CM | POA: Diagnosis not present

## 2018-08-08 DIAGNOSIS — I209 Angina pectoris, unspecified: Secondary | ICD-10-CM | POA: Diagnosis not present

## 2018-08-09 DIAGNOSIS — K219 Gastro-esophageal reflux disease without esophagitis: Secondary | ICD-10-CM | POA: Diagnosis not present

## 2018-08-09 DIAGNOSIS — M8580 Other specified disorders of bone density and structure, unspecified site: Secondary | ICD-10-CM | POA: Diagnosis not present

## 2018-08-09 DIAGNOSIS — I209 Angina pectoris, unspecified: Secondary | ICD-10-CM | POA: Diagnosis not present

## 2018-08-09 DIAGNOSIS — I10 Essential (primary) hypertension: Secondary | ICD-10-CM | POA: Diagnosis not present

## 2018-08-09 DIAGNOSIS — E43 Unspecified severe protein-calorie malnutrition: Secondary | ICD-10-CM | POA: Diagnosis not present

## 2018-08-09 DIAGNOSIS — E039 Hypothyroidism, unspecified: Secondary | ICD-10-CM | POA: Diagnosis not present

## 2018-08-10 DIAGNOSIS — I209 Angina pectoris, unspecified: Secondary | ICD-10-CM | POA: Diagnosis not present

## 2018-08-10 DIAGNOSIS — K219 Gastro-esophageal reflux disease without esophagitis: Secondary | ICD-10-CM | POA: Diagnosis not present

## 2018-08-10 DIAGNOSIS — M8580 Other specified disorders of bone density and structure, unspecified site: Secondary | ICD-10-CM | POA: Diagnosis not present

## 2018-08-10 DIAGNOSIS — I10 Essential (primary) hypertension: Secondary | ICD-10-CM | POA: Diagnosis not present

## 2018-08-10 DIAGNOSIS — E039 Hypothyroidism, unspecified: Secondary | ICD-10-CM | POA: Diagnosis not present

## 2018-08-10 DIAGNOSIS — E43 Unspecified severe protein-calorie malnutrition: Secondary | ICD-10-CM | POA: Diagnosis not present

## 2018-08-12 DIAGNOSIS — I10 Essential (primary) hypertension: Secondary | ICD-10-CM | POA: Diagnosis not present

## 2018-08-12 DIAGNOSIS — E43 Unspecified severe protein-calorie malnutrition: Secondary | ICD-10-CM | POA: Diagnosis not present

## 2018-08-12 DIAGNOSIS — K219 Gastro-esophageal reflux disease without esophagitis: Secondary | ICD-10-CM | POA: Diagnosis not present

## 2018-08-12 DIAGNOSIS — E039 Hypothyroidism, unspecified: Secondary | ICD-10-CM | POA: Diagnosis not present

## 2018-08-12 DIAGNOSIS — I209 Angina pectoris, unspecified: Secondary | ICD-10-CM | POA: Diagnosis not present

## 2018-08-12 DIAGNOSIS — M8580 Other specified disorders of bone density and structure, unspecified site: Secondary | ICD-10-CM | POA: Diagnosis not present

## 2018-08-13 DIAGNOSIS — K219 Gastro-esophageal reflux disease without esophagitis: Secondary | ICD-10-CM | POA: Diagnosis not present

## 2018-08-13 DIAGNOSIS — I209 Angina pectoris, unspecified: Secondary | ICD-10-CM | POA: Diagnosis not present

## 2018-08-13 DIAGNOSIS — M8580 Other specified disorders of bone density and structure, unspecified site: Secondary | ICD-10-CM | POA: Diagnosis not present

## 2018-08-13 DIAGNOSIS — I10 Essential (primary) hypertension: Secondary | ICD-10-CM | POA: Diagnosis not present

## 2018-08-13 DIAGNOSIS — E039 Hypothyroidism, unspecified: Secondary | ICD-10-CM | POA: Diagnosis not present

## 2018-08-13 DIAGNOSIS — E43 Unspecified severe protein-calorie malnutrition: Secondary | ICD-10-CM | POA: Diagnosis not present

## 2018-08-14 DIAGNOSIS — I10 Essential (primary) hypertension: Secondary | ICD-10-CM | POA: Diagnosis not present

## 2018-08-14 DIAGNOSIS — K219 Gastro-esophageal reflux disease without esophagitis: Secondary | ICD-10-CM | POA: Diagnosis not present

## 2018-08-14 DIAGNOSIS — M8580 Other specified disorders of bone density and structure, unspecified site: Secondary | ICD-10-CM | POA: Diagnosis not present

## 2018-08-14 DIAGNOSIS — E43 Unspecified severe protein-calorie malnutrition: Secondary | ICD-10-CM | POA: Diagnosis not present

## 2018-08-14 DIAGNOSIS — I209 Angina pectoris, unspecified: Secondary | ICD-10-CM | POA: Diagnosis not present

## 2018-08-14 DIAGNOSIS — E039 Hypothyroidism, unspecified: Secondary | ICD-10-CM | POA: Diagnosis not present

## 2018-08-15 DIAGNOSIS — K219 Gastro-esophageal reflux disease without esophagitis: Secondary | ICD-10-CM | POA: Diagnosis not present

## 2018-08-15 DIAGNOSIS — I10 Essential (primary) hypertension: Secondary | ICD-10-CM | POA: Diagnosis not present

## 2018-08-15 DIAGNOSIS — I209 Angina pectoris, unspecified: Secondary | ICD-10-CM | POA: Diagnosis not present

## 2018-08-15 DIAGNOSIS — M8580 Other specified disorders of bone density and structure, unspecified site: Secondary | ICD-10-CM | POA: Diagnosis not present

## 2018-08-15 DIAGNOSIS — E43 Unspecified severe protein-calorie malnutrition: Secondary | ICD-10-CM | POA: Diagnosis not present

## 2018-08-15 DIAGNOSIS — E039 Hypothyroidism, unspecified: Secondary | ICD-10-CM | POA: Diagnosis not present

## 2018-08-16 DIAGNOSIS — M8580 Other specified disorders of bone density and structure, unspecified site: Secondary | ICD-10-CM | POA: Diagnosis not present

## 2018-08-16 DIAGNOSIS — I209 Angina pectoris, unspecified: Secondary | ICD-10-CM | POA: Diagnosis not present

## 2018-08-16 DIAGNOSIS — I10 Essential (primary) hypertension: Secondary | ICD-10-CM | POA: Diagnosis not present

## 2018-08-16 DIAGNOSIS — E039 Hypothyroidism, unspecified: Secondary | ICD-10-CM | POA: Diagnosis not present

## 2018-08-16 DIAGNOSIS — E43 Unspecified severe protein-calorie malnutrition: Secondary | ICD-10-CM | POA: Diagnosis not present

## 2018-08-16 DIAGNOSIS — K219 Gastro-esophageal reflux disease without esophagitis: Secondary | ICD-10-CM | POA: Diagnosis not present

## 2018-08-17 DIAGNOSIS — E43 Unspecified severe protein-calorie malnutrition: Secondary | ICD-10-CM | POA: Diagnosis not present

## 2018-08-17 DIAGNOSIS — K219 Gastro-esophageal reflux disease without esophagitis: Secondary | ICD-10-CM | POA: Diagnosis not present

## 2018-08-17 DIAGNOSIS — E039 Hypothyroidism, unspecified: Secondary | ICD-10-CM | POA: Diagnosis not present

## 2018-08-17 DIAGNOSIS — M8580 Other specified disorders of bone density and structure, unspecified site: Secondary | ICD-10-CM | POA: Diagnosis not present

## 2018-08-17 DIAGNOSIS — I10 Essential (primary) hypertension: Secondary | ICD-10-CM | POA: Diagnosis not present

## 2018-08-17 DIAGNOSIS — I209 Angina pectoris, unspecified: Secondary | ICD-10-CM | POA: Diagnosis not present

## 2018-08-24 DEATH — deceased
